# Patient Record
Sex: Male | Born: 1945 | Race: White | Hispanic: No | Marital: Married | Smoking: Former smoker
Health system: Southern US, Community
[De-identification: ages and names within clinical notes are randomized; demographics above are authoritative.]

## PROBLEM LIST (undated history)

## (undated) DIAGNOSIS — G4730 Sleep apnea, unspecified: Secondary | ICD-10-CM

## (undated) DIAGNOSIS — M1990 Unspecified osteoarthritis, unspecified site: Secondary | ICD-10-CM

## (undated) DIAGNOSIS — K219 Gastro-esophageal reflux disease without esophagitis: Secondary | ICD-10-CM

## (undated) DIAGNOSIS — E7800 Pure hypercholesterolemia, unspecified: Secondary | ICD-10-CM

## (undated) DIAGNOSIS — I6521 Occlusion and stenosis of right carotid artery: Secondary | ICD-10-CM

## (undated) DIAGNOSIS — I509 Heart failure, unspecified: Secondary | ICD-10-CM

## (undated) DIAGNOSIS — K7469 Other cirrhosis of liver: Secondary | ICD-10-CM

## (undated) DIAGNOSIS — R011 Cardiac murmur, unspecified: Secondary | ICD-10-CM

## (undated) DIAGNOSIS — D649 Anemia, unspecified: Secondary | ICD-10-CM

## (undated) DIAGNOSIS — I10 Essential (primary) hypertension: Secondary | ICD-10-CM

## (undated) DIAGNOSIS — N183 Chronic kidney disease, stage 3 unspecified: Secondary | ICD-10-CM

## (undated) DIAGNOSIS — E119 Type 2 diabetes mellitus without complications: Secondary | ICD-10-CM

## (undated) DIAGNOSIS — D61818 Other pancytopenia: Principal | ICD-10-CM

## (undated) HISTORY — PX: NASAL SINUS SURGERY: SHX719

## (undated) HISTORY — PX: CATARACT EXTRACTION W/ INTRAOCULAR LENS  IMPLANT, BILATERAL: SHX1307

## (undated) HISTORY — DX: Other cirrhosis of liver: K74.69

## (undated) HISTORY — DX: Other pancytopenia: D61.818

## (undated) HISTORY — PX: TONSILLECTOMY: SUR1361

## (undated) HISTORY — PX: WISDOM TOOTH EXTRACTION: SHX21

---

## 1998-05-27 ENCOUNTER — Other Ambulatory Visit: Admission: RE | Admit: 1998-05-27 | Discharge: 1998-05-27 | Payer: Self-pay | Admitting: *Deleted

## 1999-11-10 ENCOUNTER — Encounter: Admission: RE | Admit: 1999-11-10 | Discharge: 2000-02-08 | Payer: Self-pay | Admitting: Internal Medicine

## 2001-08-30 ENCOUNTER — Inpatient Hospital Stay (HOSPITAL_COMMUNITY): Admission: EM | Admit: 2001-08-30 | Discharge: 2001-09-02 | Payer: Self-pay

## 2001-08-30 ENCOUNTER — Encounter: Payer: Self-pay | Admitting: Emergency Medicine

## 2001-08-30 ENCOUNTER — Encounter: Payer: Self-pay | Admitting: General Surgery

## 2003-01-31 ENCOUNTER — Encounter: Payer: Self-pay | Admitting: Internal Medicine

## 2003-01-31 ENCOUNTER — Encounter: Admission: RE | Admit: 2003-01-31 | Discharge: 2003-01-31 | Payer: Self-pay | Admitting: Internal Medicine

## 2004-01-02 ENCOUNTER — Ambulatory Visit (HOSPITAL_COMMUNITY): Admission: RE | Admit: 2004-01-02 | Discharge: 2004-01-02 | Payer: Self-pay | Admitting: Gastroenterology

## 2004-07-21 ENCOUNTER — Encounter: Admission: RE | Admit: 2004-07-21 | Discharge: 2004-07-21 | Payer: Self-pay | Admitting: Internal Medicine

## 2004-10-14 ENCOUNTER — Emergency Department (HOSPITAL_COMMUNITY): Admission: EM | Admit: 2004-10-14 | Discharge: 2004-10-14 | Payer: Self-pay | Admitting: *Deleted

## 2007-10-03 ENCOUNTER — Encounter: Admission: RE | Admit: 2007-10-03 | Discharge: 2007-10-03 | Payer: Self-pay | Admitting: Internal Medicine

## 2008-05-14 ENCOUNTER — Encounter: Admission: RE | Admit: 2008-05-14 | Discharge: 2008-05-14 | Payer: Self-pay | Admitting: Internal Medicine

## 2010-12-07 ENCOUNTER — Emergency Department (HOSPITAL_COMMUNITY): Payer: No Typology Code available for payment source

## 2010-12-07 ENCOUNTER — Emergency Department (HOSPITAL_COMMUNITY)
Admission: EM | Admit: 2010-12-07 | Discharge: 2010-12-07 | Disposition: A | Discharge status: Home / Self Care | Payer: No Typology Code available for payment source | Attending: Surgery | Admitting: Surgery

## 2010-12-07 DIAGNOSIS — R51 Headache: Secondary | ICD-10-CM | POA: Insufficient documentation

## 2010-12-07 DIAGNOSIS — Y929 Unspecified place or not applicable: Secondary | ICD-10-CM | POA: Insufficient documentation

## 2010-12-07 DIAGNOSIS — I10 Essential (primary) hypertension: Secondary | ICD-10-CM | POA: Insufficient documentation

## 2010-12-07 DIAGNOSIS — E119 Type 2 diabetes mellitus without complications: Secondary | ICD-10-CM | POA: Insufficient documentation

## 2010-12-07 DIAGNOSIS — M545 Low back pain, unspecified: Secondary | ICD-10-CM | POA: Insufficient documentation

## 2010-12-07 DIAGNOSIS — S335XXA Sprain of ligaments of lumbar spine, initial encounter: Secondary | ICD-10-CM | POA: Insufficient documentation

## 2010-12-07 DIAGNOSIS — M542 Cervicalgia: Secondary | ICD-10-CM | POA: Insufficient documentation

## 2011-01-06 ENCOUNTER — Inpatient Hospital Stay (HOSPITAL_COMMUNITY): Payer: No Typology Code available for payment source

## 2011-01-06 ENCOUNTER — Inpatient Hospital Stay (HOSPITAL_COMMUNITY)
Admission: AD | Admit: 2011-01-06 | Discharge: 2011-01-10 | DRG: 390 | Disposition: A | Discharge status: Home / Self Care | Payer: No Typology Code available for payment source | Source: Ambulatory Visit | Attending: Internal Medicine | Admitting: Internal Medicine

## 2011-01-06 DIAGNOSIS — F3289 Other specified depressive episodes: Secondary | ICD-10-CM | POA: Diagnosis present

## 2011-01-06 DIAGNOSIS — Z794 Long term (current) use of insulin: Secondary | ICD-10-CM

## 2011-01-06 DIAGNOSIS — E669 Obesity, unspecified: Secondary | ICD-10-CM | POA: Diagnosis present

## 2011-01-06 DIAGNOSIS — Z882 Allergy status to sulfonamides status: Secondary | ICD-10-CM

## 2011-01-06 DIAGNOSIS — Z87891 Personal history of nicotine dependence: Secondary | ICD-10-CM

## 2011-01-06 DIAGNOSIS — F329 Major depressive disorder, single episode, unspecified: Secondary | ICD-10-CM | POA: Diagnosis present

## 2011-01-06 DIAGNOSIS — Z6831 Body mass index (BMI) 31.0-31.9, adult: Secondary | ICD-10-CM

## 2011-01-06 DIAGNOSIS — Z888 Allergy status to other drugs, medicaments and biological substances status: Secondary | ICD-10-CM

## 2011-01-06 DIAGNOSIS — E785 Hyperlipidemia, unspecified: Secondary | ICD-10-CM | POA: Diagnosis present

## 2011-01-06 DIAGNOSIS — K3184 Gastroparesis: Secondary | ICD-10-CM | POA: Diagnosis present

## 2011-01-06 DIAGNOSIS — E559 Vitamin D deficiency, unspecified: Secondary | ICD-10-CM | POA: Diagnosis present

## 2011-01-06 DIAGNOSIS — G4700 Insomnia, unspecified: Secondary | ICD-10-CM | POA: Diagnosis present

## 2011-01-06 DIAGNOSIS — E039 Hypothyroidism, unspecified: Secondary | ICD-10-CM | POA: Diagnosis present

## 2011-01-06 DIAGNOSIS — I10 Essential (primary) hypertension: Secondary | ICD-10-CM | POA: Diagnosis present

## 2011-01-06 DIAGNOSIS — Z79899 Other long term (current) drug therapy: Secondary | ICD-10-CM

## 2011-01-06 DIAGNOSIS — E291 Testicular hypofunction: Secondary | ICD-10-CM | POA: Diagnosis present

## 2011-01-06 DIAGNOSIS — E7800 Pure hypercholesterolemia, unspecified: Secondary | ICD-10-CM | POA: Diagnosis present

## 2011-01-06 DIAGNOSIS — N529 Male erectile dysfunction, unspecified: Secondary | ICD-10-CM | POA: Diagnosis present

## 2011-01-06 DIAGNOSIS — E1149 Type 2 diabetes mellitus with other diabetic neurological complication: Secondary | ICD-10-CM | POA: Diagnosis present

## 2011-01-06 DIAGNOSIS — Z7982 Long term (current) use of aspirin: Secondary | ICD-10-CM

## 2011-01-06 DIAGNOSIS — K56609 Unspecified intestinal obstruction, unspecified as to partial versus complete obstruction: Principal | ICD-10-CM | POA: Diagnosis present

## 2011-01-06 DIAGNOSIS — Z881 Allergy status to other antibiotic agents status: Secondary | ICD-10-CM

## 2011-01-06 LAB — GLUCOSE, CAPILLARY: Glucose-Capillary: 266 mg/dL — ABNORMAL HIGH (ref 70–99)

## 2011-01-07 ENCOUNTER — Inpatient Hospital Stay (HOSPITAL_COMMUNITY): Payer: No Typology Code available for payment source

## 2011-01-07 ENCOUNTER — Encounter (HOSPITAL_COMMUNITY): Payer: Self-pay | Admitting: Radiology

## 2011-01-07 LAB — BASIC METABOLIC PANEL
Calcium: 9.6 mg/dL (ref 8.4–10.5)
GFR calc Af Amer: 45 mL/min — ABNORMAL LOW (ref >60)
GFR calc non Af Amer: 37 mL/min — ABNORMAL LOW (ref >60)
Glucose, Bld: 207 mg/dL — ABNORMAL HIGH (ref 70–99)
Potassium: 4.4 mEq/L (ref 3.5–5.1)
Sodium: 135 mEq/L (ref 135–145)

## 2011-01-07 LAB — GLUCOSE, CAPILLARY
Glucose-Capillary: 170 mg/dL — ABNORMAL HIGH (ref 70–99)
Glucose-Capillary: 190 mg/dL — ABNORMAL HIGH (ref 70–99)
Glucose-Capillary: 191 mg/dL — ABNORMAL HIGH (ref 70–99)
Glucose-Capillary: 199 mg/dL — ABNORMAL HIGH (ref 70–99)
Glucose-Capillary: 252 mg/dL — ABNORMAL HIGH (ref 70–99)
Glucose-Capillary: 338 mg/dL — ABNORMAL HIGH (ref 70–99)

## 2011-01-07 LAB — CBC
MCHC: 32.8 g/dL (ref 30.0–36.0)
MCV: 92.4 fL (ref 78.0–100.0)
Platelets: 148 10*3/uL — ABNORMAL LOW (ref 150–400)
RDW: 14.5 % (ref 11.5–15.5)
WBC: 6 10*3/uL (ref 4.0–10.5)

## 2011-01-07 LAB — HEPATIC FUNCTION PANEL
AST: 23 U/L (ref 0–37)
Bilirubin, Direct: 0.3 mg/dL (ref 0.0–0.3)
Indirect Bilirubin: 0.5 mg/dL (ref 0.3–0.9)

## 2011-01-07 LAB — LIPASE, BLOOD: Lipase: 44 U/L (ref 11–59)

## 2011-01-07 MED ORDER — IOHEXOL 300 MG/ML  SOLN
80.0000 mL | Freq: Once | INTRAMUSCULAR | Status: AC | PRN
  Administered 2011-01-07: 80 mL via INTRAVENOUS

## 2011-01-08 ENCOUNTER — Inpatient Hospital Stay (HOSPITAL_COMMUNITY): Payer: No Typology Code available for payment source

## 2011-01-08 LAB — CBC
HCT: 42.9 % (ref 39.0–52.0)
Hemoglobin: 13.9 g/dL (ref 13.0–17.0)
MCV: 93.7 fL (ref 78.0–100.0)
RBC: 4.58 MIL/uL (ref 4.22–5.81)
WBC: 5 10*3/uL (ref 4.0–10.5)

## 2011-01-08 LAB — COMPREHENSIVE METABOLIC PANEL
ALT: 44 U/L (ref 0–53)
Alkaline Phosphatase: 65 U/L (ref 39–117)
BUN: 41 mg/dL — ABNORMAL HIGH (ref 6–23)
Chloride: 98 mEq/L (ref 96–112)
Glucose, Bld: 158 mg/dL — ABNORMAL HIGH (ref 70–99)
Potassium: 4 mEq/L (ref 3.5–5.1)
Sodium: 137 mEq/L (ref 135–145)
Total Bilirubin: 0.7 mg/dL (ref 0.3–1.2)
Total Protein: 6.6 g/dL (ref 6.0–8.3)

## 2011-01-08 LAB — GLUCOSE, CAPILLARY
Glucose-Capillary: 166 mg/dL — ABNORMAL HIGH (ref 70–99)
Glucose-Capillary: 235 mg/dL — ABNORMAL HIGH (ref 70–99)
Glucose-Capillary: 192 mg/dL — ABNORMAL HIGH (ref 70–99)

## 2011-01-09 ENCOUNTER — Inpatient Hospital Stay (HOSPITAL_COMMUNITY): Payer: No Typology Code available for payment source

## 2011-01-09 LAB — BASIC METABOLIC PANEL
CO2: 28 mEq/L (ref 19–32)
Calcium: 9.4 mg/dL (ref 8.4–10.5)
Creatinine, Ser: 1.25 mg/dL (ref 0.4–1.5)
Glucose, Bld: 163 mg/dL — ABNORMAL HIGH (ref 70–99)

## 2011-01-09 LAB — GLUCOSE, CAPILLARY
Glucose-Capillary: 181 mg/dL — ABNORMAL HIGH (ref 70–99)
Glucose-Capillary: 180 mg/dL — ABNORMAL HIGH (ref 70–99)

## 2011-01-10 LAB — DIFFERENTIAL
Eosinophils Absolute: 0.2 10*3/uL (ref 0.0–0.7)
Eosinophils Relative: 5 % (ref 0–5)
Lymphs Abs: 1.3 10*3/uL (ref 0.7–4.0)
Monocytes Absolute: 0.4 10*3/uL (ref 0.1–1.0)

## 2011-01-10 LAB — CBC
MCHC: 33 g/dL (ref 30.0–36.0)
MCV: 90.2 fL (ref 78.0–100.0)
Platelets: 126 10*3/uL — ABNORMAL LOW (ref 150–400)
RDW: 13.8 % (ref 11.5–15.5)
WBC: 5.1 10*3/uL (ref 4.0–10.5)

## 2011-01-10 LAB — COMPREHENSIVE METABOLIC PANEL
Albumin: 3.4 g/dL — ABNORMAL LOW (ref 3.5–5.2)
Alkaline Phosphatase: 64 U/L (ref 39–117)
BUN: 19 mg/dL (ref 6–23)
Calcium: 9.4 mg/dL (ref 8.4–10.5)
Creatinine, Ser: 1.14 mg/dL (ref 0.4–1.5)
Potassium: 3.8 mEq/L (ref 3.5–5.1)
Total Protein: 6.6 g/dL (ref 6.0–8.3)

## 2011-01-10 LAB — GLUCOSE, CAPILLARY
Glucose-Capillary: 148 mg/dL — ABNORMAL HIGH (ref 70–99)
Glucose-Capillary: 176 mg/dL — ABNORMAL HIGH (ref 70–99)
Glucose-Capillary: 226 mg/dL — ABNORMAL HIGH (ref 70–99)
Glucose-Capillary: 230 mg/dL — ABNORMAL HIGH (ref 70–99)

## 2011-01-11 NOTE — Consult Note (Signed)
NAMESALATHIEL, Potter               ACCOUNT NO.:  [id number]  MEDICAL RECORD NO.:  [id number]           PATIENT TYPE:  I  LOCATION:  [id number]                         FACILITY:  [facility]  PHYSICIAN:  Cyncere Sontag A. Rozlynn Lippold, M.D.DATE OF BIRTH:  1945-12-24  DATE OF CONSULTATION:  01/06/2011 DATE OF DISCHARGE:                                CONSULTATION   REQUESTING PHYSICIAN:  Henrine Screws, MD  REASON FOR CONSULTATION:  Abdominal pain, possible bowel obstruction.  HISTORY OF PRESENT ILLNESS:  Javier Potter is a pleasant 65 year old man who was in his usual state of health up until about 2 days ago.  He states he started having significant abdominal fullness and discomfort about an hour to 2 hours after "over" eating dinner.  He states he ate some fish with some side dishes.  The pain has really not relented.  Since that point, he had been very nauseated but has not vomited.  He has had 2 small bowel movements yesterday that were normal but he has not been able to pass any flatus.  He denies any fever, chills, chest pain, shortness of breath.  He denies any dysuria, hematuria.  He reports the pain is generalized, diffusely throughout his abdomen without any specific area of pain.  His bowel movements were normal leading up to this and he takes Metamucil on a daily basis that keeps him quite regular.  Interestingly, he had a similar episode like this in 2003, was admitted, had a bowel obstruction but his workup was essentially negative and his symptoms resolved.  Again, in 2009, he an episode of abdominal pain, was found to have gallstones but had a normal HIDA scan although his ejection fraction was slightly diminished at 38%.  Again, his symptoms resolved and he was discharged without any surgical intervention.  He was seen in the office today by his primary care provider and found to have evidence of dilated loops of bowel on a plain film with some abnormal labs including elevated liver  enzymes. Therefore, the patient was admitted for inpatient management and we have been asked to consult on the patient.  CT scan has been ordered but not yet performed.  PAST MEDICAL HISTORY:  Significant for: 1. Hypertension. 2. Type 2 diabetes. 3. Insomnia. 4. Obesity. 5. Hyperlipidemia. 6. History of gallstones. 7. History of hemorrhoids.  SURGICAL HISTORY:  The patient has had sinus surgery and septoplasty as well as a tonsillectomy but no prior abdominal surgeries.  FAMILY HISTORY:  Noncontributory to the present case.  SOCIAL HISTORY:  The patient is a former smoker, quit approximately 15 years ago.  Denies any alcohol or illicit drug use.  He is married and works in Herbalist.  MEDICATIONS:  As listed.  ALLERGIES:  AMBIEN, AUGMENTIN and SULFA drugs.  REVIEW OF SYSTEMS:  See in history of present illness for pertinent findings, otherwise, complete 10 system review found negative.  PHYSICAL EXAMINATION:  VITAL SIGNS:  Temperature of 97.9, heart rate of 84, respiratory rate of 22, blood pressure 121/75, oxygen saturation 95% on room air. GENERAL APPEARANCE:  A 65 year old gentleman who looks in some mild distress though  he is drinking the contrast for his scheduled CT.  He does not appear toxic appearing. ENT:  Otherwise, unremarkable. NECK:  Supple without lymphadenopathy.  Trachea is midline.  No thyromegaly or masses. LUNGS:  Clear to auscultation.  No wheezes, rhonchi or rales. HEART:  Regular rate and rhythm.  No murmurs, gallops or rubs.  Carotids 2+ and brisk without bruits.  Peripheral pulses intact and symmetrical. ABDOMEN:  Grossly distended with palpable loops of distended bowel.  No hernias are appreciated.  No mass effect is appreciated. RECTAL:  Deferred. GENITOURINARY:  Within normal limits.  No inguinal hernias or defects are appreciated. EXTREMITIES:  Good active range of motion in all extremities without crepitus or pain.  Normal muscle  strength and tone without atrophy. SKIN:  Otherwise, warm and dry with good turgor.  No rashes, lesions, nodules. NEUROLOGIC:  The patient alert and oriented x3.  DIAGNOSTICS:  Labs performed at the outpatient office showed a white blood cell count of 7.8, hemoglobin of 16.3, hematocrit of 46.1, platelet count of 200.  Metabolic panel shows a sodium of 131, potassium of 4.7, chloride of 94, CO2 of 23, BUN of 32, creatinine of 1.7, glucose of 347.  Liver enzymes slightly elevated with a bilirubin of 1.7, alkaline phosphatase of 69, AST of 42, ALT of 62, lipase of 95.  IMAGING:  Plain films show evidence of dilated loops of bowel.  IMPRESSION: 1. Abdominal pain and distention. 2. Elevated liver enzymes with a history of gallstones. 3. Possible partial small-bowel obstruction/ileus, a remote     possibility of gallstone ileus.  RECOMMENDATIONS:  Agree with n.p.o. status and antibiotics as ordered. We will await the CT scan to further identify possible acute process in the patient's abdomen.  If the patient's distention becomes quite uncomfortable or if he begins vomiting would recommend NG tube to low intermittent wall suction.  We will be having to continue consulting the patient and manage appropriately.     Javier Dike, PA-C   ______________________________ Javier Potter, M.D.    KB/MEDQ  D:  01/06/2011  T:  01/07/2011  Job:  [id number]  cc:   Henrine Screws, M.D.  Electronically Signed by Javier Potter  on 01/11/2011 01:25:32 PM Electronically Signed by Erroll Luna M.D. on 01/11/2011 01:47:48 PM

## 2011-01-14 NOTE — H&P (Signed)
[place]  Patient:    Javier Potter, Javier Potter Visit Number: [id number] MRN: [id number]          Service Type: MED Location: [phone] Attending Physician:  Gwenyth Ober Dictated by:   Judeth Horn, M.D. Admit Date:  08/30/2001   CC:         Henrine Screws, M.D.   History and Physical  IDENTIFICATION AND CHIEF COMPLAINT:  The patient is a 65 year old gentleman with abdominal pain localized to the right lower quadrant.  HISTORY OF PRESENT ILLNESS:  The patient has been ill for at least a couple of days.  Abdominal pain was starting on the right lower quadrant and is still in the right lower quadrant associated with nausea, vomiting in the emergency room only.  The patient did state that he had some chills and sweats at home just prior to coming in to the ED and, because of worsening pain, he came in for evaluation.  The patient had a non-contrast CT scan done in the emergency room which demonstrated no evidence of acute appendicitis but distended small-bowel loops.  No contrast was given because the patient was being worked up for a possible kidney stone.  However, the CT is being repeated with rectal contrast to rule out appendicitis.  He has a mildly elevated white cell count.  PAST MEDICAL HISTORY:  Significant for non-insulin-dependent diabetes, GERD, and he has had guaiac-positive stools in the past.  He has had a colonoscopy in November along with a flexible sigmoidoscopy, all of which were negative for tumor.  CURRENT MEDICATIONS:  Include Prevacid, glyburide, and Glucophage.  ALLERGIES:  SULFA.  PAST SURGICAL HISTORY:  He has had previous surgeries for sinuses and also for T&A.  REVIEW OF SYSTEMS:  His last bowel movement was over 24 hours ago.  He usually does go every day.  He has had no diarrhea, no definite fever, no history of hernias.  PHYSICAL EXAMINATION:  VITAL SIGNS:  His pulse was 114 on admission and went down  to 96 with hydration.  Blood pressure 92/53 initially, went up to 120/70 with hydration. Respirations 16, temperature 96.6 and 97.1.  He has not spiked.  HEENT:  He is normocephalic, atraumatic, and anicteric.  Pupils are equal, round and reactive to light.  NECK:  Supple.  CHEST:  Clear.  CARDIAC:  Regular rhythm and rate with no murmurs.  ABDOMEN:  Distended.  He has hypoactive bowel sounds.  No rebound with non-voluntary guarding in the right lower quadrant where he had some mild focal peritonitis.  He does not have any cough, tap, tenderness, or Rovsing sign.  RECTAL:  No focal masses.  Guaiac negative.  EXTREMITIES:  Negative for deformities or swelling.  LABORATORY DATA:  He was dehydrated with a hemoglobin of 16.9, hematocrit 50.1, white cell count 11.7.  Platelets were normal.  BUN 10, creatinine 1.2. He has mildly elevated ALT and AST with normal total bilirubin and alkaline phosphatase, lipase, and amylase.  He does have gallstones on CT scan without evidence of acute cholecystitis.  IMPRESSION: 1. Abdominal pain, unknown etiology. 2. There is a possibility of a bowel obstruction which would have to be from    tumor or hernia, and I do not find any hernias on his exam.  The patient    has had no previous intra-abdominal procedures.  PLAN:  Because of the possibility that he has gastroenteritis versus appendicitis, a CT scan with rectal contrast is to be  done.  We plan on doing this as soon as possible.  I will also manage his hyperglycemia.  The gallstone appears to be asymptomatic; however, with the abnormal liver function tests, it may be possible that he just has an unusual presentation of cholecystitis.  Will get the CT scan and then possibly get a HIDA scan in the morning. Dictated by:   Judeth Horn, M.D. Attending Physician:  Gwenyth Ober DD:  08/30/01 TD:  08/30/01 Job: [id number]

## 2011-01-14 NOTE — Discharge Summary (Signed)
[place]  Patient:    Javier Potter, Javier Potter Visit Number: [id number] MRN: [id number]          Service Type: MED Location: [phone] Attending Physician:  Gwenyth Ober Dictated by:   Judeth Horn, M.D. Admit Date:  08/30/2001 Discharge Date: 09/02/2001                             Discharge Summary  DISCHARGE DIAGNOSES: 1. Acute abdominal pain, unknown clear etiology, possible acute    gastroenteritis. 2. Non-insulin-dependent diabetes mellitus.  DISCHARGE MEDICATIONS:  None.  FOLLOWUP: 1. Dr. Hulen Skains as needed. 2. Schedule an appointment to see Dr. Inda Merlin.  DIET:  Diabetic ADA diet.  HOSPITAL COURSE:  The patient was admitted with acute abdominal pain.  Dr. Inda Merlin sent him over, possible acute appendicitis or cholecystitis.  Although he had abnormal liver function tests, his pain was in the right lower quadrant.  A CT scan with contrast was done which showed no evidence of acute appendicitis, but he had distended and thickened small bowel loops consistent with a possible peritonitis.  He had previous sigmoidoscopy and colonoscopy.  The patient was admitted overnight, and by the following day after admission with hydration, he was 100% improved with no abdominal pain, good bowel sounds, and had some diarrhea.  He continued to improve over the next 48 hours, and was discharged to home on hospital day #3, eating well, having normal bowel movements, passing gas, with no abdominal pain.  His followup was to see Dr. Inda Merlin.  He was afebrile at the time of discharge.  Not placed on any antibiotics during this course. Dictated by:   Judeth Horn, M.D. Attending Physician:  Gwenyth Ober DD:  09/10/01 TD:  09/11/01 Job: [id number]

## 2011-01-20 NOTE — H&P (Signed)
NAMEJACORRI, CROMBIE               ACCOUNT NO.:  [id number]  MEDICAL RECORD NO.:  [id number]           PATIENT TYPE:  I  LOCATION:  [id number]                         FACILITY:  [facility]  PHYSICIAN:  Henrine Screws, M.D.DATE OF BIRTH:  [birth year]  DATE OF ADMISSION:  01/06/2011 DATE OF DISCHARGE:                             HISTORY & PHYSICAL   PROBLEM:  Generalized abdominal pain with history of bowel obstruction. The patient has a 2-day history of generalized abdominal pain that is burning in nature.  He has been nauseated without vomiting.  Only had 2 small bowel movements.  Cannot burp or pass gas.  Has a history of small bowel obstruction in the past.  No prior abdominal surgeries.  He has had shaking, chills, and sweats.  CURRENT MEDICATIONS: 1. Anusol-HC 25 mg 1 a day. 2. Trilipix 135 mg 1 a day. 3. Fish oil 1200 mg 4 capsules a day. 4. Testosterone 200 mg in 1 mL oil, 1 mL IM q.2 weeks. 5. Aspirin 81 mg 1 a day. 6. Vitamin B12 injection 1000 UG liquid monthly. 7. Zyrtec allergy 10 mg 1 a day. 8. Crestor 10 mg 1 a day. 9. Cialis 20 mg 1 tablet as needed. 10.Fluticasone 1 puff 2-3 times a week. 11.Glyburide 5 mg 1 b.i.d. 12.Niacin flush free 1 a day. 13.Chlorthalidone 25 mg 1 a day. 14.Toprol-XL 50 mg 1 a day. 15.Lantus 100 units/mL solution 90 units a.m. and p.m. 16.Zestril 20 mg 1 a day. 17.Klor-Con 20 mEq 1 a day. 18.Remeron 15 mg 1 at bedtime. 19.Prilosec 20 mg 1 b.i.d. 20.Metformin 1000 mg b.i.d. 21.Citalopram 40 mg 1 a day. 22.NovoLog pen 100 units/mL solution 36 units 3 times a day.  MEDICAL HISTORY: 1. Hypertension. 2. Type 2 diabetes mellitus. 3. Hypothyroidism. 4. Insomnia. 5. Obesity. 6. Folliculitis. 7. Hypercholesterolemia. 8. 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 9. Actinic keratosis and seborrheic keratosis. 10.Major depression in August 2010. 11.B12 deficiency. 12.Hemorrhoid/diminished anal tone, Dr. Armandina Gemma in November 2011.  DRUG  ALLERGIES AND INTOLERANCES: 1. AMBIEN CR, question of depression reaction. 2. AUGMENTIN, abdominal pain/diarrhea. 3. SULFA DRUG, blister on tongue.  SURGICAL HISTORY: 1. Extensive sinus surgery. 2. Tonsillectomy at age 41. 3. Septoplasty with bilateral endoscopic sinus surgery, Dr. Harle Battiest in     1999.  No abdominal surgeries  FAMILY HISTORY:  Father died of lung cancer and tongue cancer at the age of 61.  Mother died at age 40 of renal failure.  One sister with back pain.  Two children are alive and well.  SOCIAL HISTORY:  Former smoker, quit 15 years ago, 3-pack a day history. Alcohol rare.  No recreational drug.  His occupation is Press photographer, Insurance underwriter.  His marital status is married.  REVIEW OF SYSTEMS:  Complains of generalized abdominal pain with nausea. No vomiting.  Shaking, chills, and sweats.  Two small bowel movements. Not passing gas or burping.  History of small bowel obstruction.  PHYSICAL EXAMINATION:  VITAL SIGNS:  Weight 197 which is a 6-pound weight loss.  Height 66 inches.  BMI of 31.79.  Temperature 98.5.  Pulse 80.  BP 120/80. GENERAL APPEARANCE:  Alert and oriented.  SKIN:  Normal. HEENT:  Grossly normal.  Oral cavity clear. CHEST:  Clear to auscultation. HEART:  No S3, S4, murmurs, or rubs. ABDOMEN:  Generally tender throughout.  More specific pain in the right lower quadrant.  There are no masses or organomegaly.  No rebound or peritonitis and no bowel sounds auscultated.  ASSESSMENT:  Abdominal pain with history of bowel obstruction.  I did proceed with a flat and upright of the abdomen.  Normal to my eye.  Did proceed with CBC.  Neutrophil percent 84.1.  Lymphocyte percent 10.8. Monocyte percent 4.3.  Lymphocytic number 0.80.  Comprehensive metabolic:  Glucose of [id number].  BUN of 32 with creatinine of 1.72.  GFR of 40.23.  Sodium of 131.  Chloride of 94.  Calcium of 11.5.  Total bilirubin of 1.7.  AST 42, ALT 62, otherwise normal.  Lipase mildly elevated at  95. Urinalysis:  Trace of ketones, +1 bilirubin, otherwise normal.  The patient is admitted for further evaluation including CT of the abdomen and pelvis.  We will start Cipro and Flagyl IV to cover diverticulitis.     Marliss Coots, N.P.   ______________________________ Henrine Screws, M.D.    MAP/MEDQ  D:  01/06/2011  T:  01/07/2011  Job:  [id number]  Electronically Signed by Jack Quarto N.P. on 01/19/2011 10:13:23 AM Electronically Signed by Josetta Huddle M.D. on 01/20/2011 [id number] PM

## 2011-01-20 NOTE — Discharge Summary (Signed)
Javier Potter, Javier Potter               ACCOUNT NO.:  [id number]  MEDICAL RECORD NO.:  [id number]           PATIENT TYPE:  I  LOCATION:  [id number]                         FACILITY:  [facility]  PHYSICIAN:  Henrine Screws, M.D.DATE OF BIRTH:  May 12, 1946  DATE OF ADMISSION:  01/06/2011 DATE OF DISCHARGE:  01/10/2011                              DISCHARGE SUMMARY   DISCHARGE DIAGNOSES: 1. Small-bowel obstruction felt to be in the area of the distal small     bowel.  The patient had episode approximately 10 years ago of small-     bowel obstruction with no recurrences until this one. 2. 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  There was free fluid in the right lower     quadrant around the cecum and extending down to the pelvis     suggesting inflammatory process which was the likely cause of     obstruction.  There was mild thickening to the wall of the terminal     ileum.  There was also stenosis of the sigmoid colon without     inflammatory change. 3. Insulin-dependent diabetes mellitus. 4. History of hemorrhoids. 5. Hypogonadism with testosterone level decreased at 174 during this     hospitalization. 6. Allergies. 7. Hyperlipidemia. 8. Erectile dysfunction. 9. Hypertension. 10.Hypothyroidism. 11.Obesity. 12.Probable mild diabetic gastroparesis. 13.Major depression August 2010. 14.Vitamin B12 deficiency. 15.History of insomnia.  DRUG ALLERGIES AND INTOLERANCES:  AMBIEN CR has questionably caused depression.  AUGMENTIN has caused abdominal pain, diarrhea, and SULFA drugs have caused blisters on his tongue.  DISCHARGE MEDICATIONS: 1. Aspirin 325 mg daily. 2. Chlorthalidone 25 mg daily. 3. Clonazepam 1 mg daily. 4. Crestor 10 mg daily. 5. Fish oil 1 gram b.i.d. 6. Glyburide 5 mg b.i.d. 7. Iron tablet daily. 8. Lantus 9 units subcu b.i.d. 9. Lisinopril 10 mg daily. 10.Magnesium chloride over-the-counter 1 tablet daily as  per prior to     admission. 11.Metformin 1 gram daily. 12.Metoprolol XL succinate 50 mg daily. 13.Multivitamin daily. 14.NovoLog insulin 36 units subcu 3 times daily with meals. 15.Niacin flush free 1 tablet daily. 16.Potassium chloride 20 mEq daily. 17.Prilosec 20 mg b.i.d. 18.Zyrtec 10 mg daily.  CONSULTATIONS:  Jan 06, 2011 Dr. Erroll Luna, General Surgery.  The patient does also have a history of gallstones.  DISCHARGE LABORATORIES:  From Jan 10, 2011, white count 5100, hemoglobin 13.7, platelet count 126,000 with normal differential, 63% neutrophils, 25% lymphocytes, 7% monocytes, 5% eosinophils.  Comprehensive metabolic profile on Jan 10, 2011, sodium 137, potassium 3.8, chloride 101, bicarb 26, glucose 199, BUN 19, creatinine 1.14, total bili 0.6, alk phos 64, SGOT 47, SGPT 43, albumin 3.4, calcium 9.4.  Vitamin B12 was normal at 542 on Jan 08, 2011.  TSH was 3.78 and testosterone level was low at 175.  Normal levels of 241-827.  Lipase on Jan 08, 2011 was normal at 45 and on admission was 37, normal.  HOSPITAL COURSE:  Mr. Caryl Comes is very pleasant 65 year old male who had a small bowel obstruction about 10 years ago.  He started having significant  abdominal fullness and discomfort after eating dinner the night prior to admission.  He had had some fishes from side dishes. Pain persisted and he became very nauseous.  He had 2 small bowel movements on the day prior to admission.  He presented to [facility] with generalized diffuse abdominal pain, right lower quadrant more tender than left.  He was quite distended.  He also was having some chills, rigors, and sweats.  He was admitted and abdominal x-ray and CT scan revealed small bowel obstruction primarily related to the right lower quadrant.  This seemed to be some terminal ileum thickening and free fluid but there was also some sigmoid narrowing as well.  The patient was put on IV fluids and IV Cipro  and Flagyl and ultimately needed an NG tube to help decompress the small bowel.  This was removed the day prior to discharge and he tolerated this well as well as meals. He will be discharged home if he tolerates breakfast and lunch today. He will be fed a soft diet.  No concentrated sweets.  While admitted, his diabetes was controlled with sliding scale NovoLog and Lantus.  Other oral medications have been resumed.  The patient will be discharged home in improved condition this afternoon if he tolerates breakfast and lunch and we will plan for GI consultation as an outpatient for probable colonoscopy.  Cause for small bowel obstruction is unknown at this time.  Time spent reviewing chart, discussing clinical status with the patient and performing discharge summary was 35 minutes.     Henrine Screws, M.D.     RNG/MEDQ  D:  01/10/2011  T:  01/10/2011  Job:  [id number]  Electronically Signed by Josetta Huddle M.D. on 01/20/2011 06:08:57 PM

## 2013-12-04 ENCOUNTER — Encounter (HOSPITAL_COMMUNITY): Payer: Self-pay | Admitting: Internal Medicine

## 2013-12-05 ENCOUNTER — Encounter: Payer: Self-pay | Admitting: Cardiology

## 2013-12-05 ENCOUNTER — Ambulatory Visit (HOSPITAL_COMMUNITY): Payer: No Typology Code available for payment source | Attending: Cardiology | Admitting: Radiology

## 2013-12-05 VITALS — BP 144/79 | Ht 66.0 in | Wt 191.0 lb

## 2013-12-05 DIAGNOSIS — I10 Essential (primary) hypertension: Secondary | ICD-10-CM | POA: Insufficient documentation

## 2013-12-05 DIAGNOSIS — Z794 Long term (current) use of insulin: Secondary | ICD-10-CM | POA: Insufficient documentation

## 2013-12-05 DIAGNOSIS — I517 Cardiomegaly: Secondary | ICD-10-CM | POA: Insufficient documentation

## 2013-12-05 DIAGNOSIS — Z87891 Personal history of nicotine dependence: Secondary | ICD-10-CM | POA: Insufficient documentation

## 2013-12-05 DIAGNOSIS — M25569 Pain in unspecified knee: Secondary | ICD-10-CM | POA: Insufficient documentation

## 2013-12-05 DIAGNOSIS — R9431 Abnormal electrocardiogram [ECG] [EKG]: Secondary | ICD-10-CM | POA: Insufficient documentation

## 2013-12-05 DIAGNOSIS — Z8249 Family history of ischemic heart disease and other diseases of the circulatory system: Secondary | ICD-10-CM | POA: Insufficient documentation

## 2013-12-05 DIAGNOSIS — E119 Type 2 diabetes mellitus without complications: Secondary | ICD-10-CM | POA: Insufficient documentation

## 2013-12-05 MED ORDER — TECHNETIUM TC 99M SESTAMIBI GENERIC - CARDIOLITE
33.0000 | Freq: Once | INTRAVENOUS | Status: AC | PRN
  Administered 2013-12-05: 33 via INTRAVENOUS

## 2013-12-05 MED ORDER — REGADENOSON 0.4 MG/5ML IV SOLN
0.4000 mg | Freq: Once | INTRAVENOUS | Status: AC
  Administered 2013-12-05: 0.4 mg via INTRAVENOUS

## 2013-12-05 MED ORDER — TECHNETIUM TC 99M SESTAMIBI GENERIC - CARDIOLITE
11.0000 | Freq: Once | INTRAVENOUS | Status: AC | PRN
Start: 2013-12-05 — End: 2013-12-05
  Administered 2013-12-05: 11 via INTRAVENOUS

## 2013-12-05 NOTE — Progress Notes (Signed)
[facility] [address] [place], [place] [address] [phone]    Cardiology Nuclear Med Study  Javier Potter is a 68 y.o. male     MRN : [id number]     DOB: April 01, 1946  Procedure Date: 12/05/2013  Nuclear Med Background Indication for Stress Test:  Evaluation for Ischemia and Abnormal EKG History:  No cardiac history Cardiac Risk Factors: Family History - CAD, History of Smoking, Hypertension, IDDM, and Lipids  Symptoms:  No cardiac symptoms   Nuclear Pre-Procedure Caffeine/Decaff Intake:  None > 12 hrs NPO After: 9:30pm   Lungs:  clear O2 Sat: 98% on room air. IV 0.9% NS with Angio Cath:  22g  IV Site: R Antecubital x 1, tolerated well IV Started by:  Javier Baltimore, RN  Chest Size (in):  44 Cup Size: n/a  Height: 5\' 6"  (1.676 m)  Weight:  191 lb (86.637 kg)  BMI:  Body mass index is 30.84 kg/(m^2). Tech Comments:  Held metoprolol x 24 hrs. Fasting CBG was 267 at 0930 with 1/2 dose Novolog and Lantus insulin taken with breakfast. Javier Baltimore, RN    Nuclear Med Study 1 or 2 day study: 1 day  Stress Test Type:  Stress  Reading MD: N/A  Order Authorizing Provider:  Josetta Huddle, MD  Resting Radionuclide: Technetium 18m Sestamibi  Resting Radionuclide Dose: 11.0 mCi   Stress Radionuclide:  Technetium 44m Sestamibi  Stress Radionuclide Dose: 33.0 mCi           Stress Protocol Rest HR: 69 Stress HR: 104  Rest BP: 144/79 Stress BP: 183/56  Exercise Time (min): 5:36 METS: 5.4   Predicted Max HR: 153 bpm % Max HR: 67.97 bpm Rate Pressure Product: 19032   Dose of Adenosine (mg):  n/a Dose of Lexiscan: 0.4 mg  Dose of Atropine (mg): n/a Dose of Dobutamine: n/a mcg/kg/min (at max HR)  Stress Test Technologist: Crissie Figures, RN  Nuclear Technologist:  Charlton Amor, CNMT     Rest Procedure:  Myocardial perfusion imaging was performed at rest 45 minutes following the intravenous administration of Technetium 30m Sestamibi. Rest ECG: NSR  inferior ST segment changes  Stress Procedure:  The patient attempted to walk the treadmill utilizing the Bruce protocol. He was unable to achieve target heart rate due to knee pain and was changed to a walking Lexiscan. The patient received IV Lexiscan 0.4 mg over 15-seconds with concurrent low level exercise and then Technetium 21m Sestamibi was injected at 30-seconds while the patient continued walking one more minute.  Quantitative spect images were obtained after a 45-minute delay. Stress ECG: No significant change from baseline ECG  QPS Raw Data Images:  Normal; no motion artifact; normal heart/lung ratio. Stress Images:  Normal homogeneous uptake in all areas of the myocardium. Rest Images:  Normal homogeneous uptake in all areas of the myocardium. Subtraction (SDS):  Normal Transient Ischemic Dilatation (Normal <1.22):  0.95 Lung/Heart Ratio (Normal <0.45):  0.38  Quantitative Gated Spect Images QGS EDV:  126 ml QGS ESV:  68 ml  Impression Exercise Capacity:  Poor exercise capacity. BP Response:  Normal blood pressure response. Clinical Symptoms:  Knee Pain ECG Impression:  No significant ST segment change suggestive of ischemia. Comparison with Prior Nuclear Study: No images to compare  Overall Impression:  Low risk stress nuclear study Evidence for LVH and mild diffuse hypokinesis EF 46%  No perfusion defects.  LV Ejection Fraction: 46%.  LV Wall Motion:  Diffuse hypokinesis  mildly decreased EF     Josue Hector

## 2014-02-12 DIAGNOSIS — Z23 Encounter for immunization: Secondary | ICD-10-CM | POA: Diagnosis not present

## 2014-02-12 DIAGNOSIS — E669 Obesity, unspecified: Secondary | ICD-10-CM | POA: Diagnosis not present

## 2014-02-12 DIAGNOSIS — N183 Chronic kidney disease, stage 3 unspecified: Secondary | ICD-10-CM | POA: Diagnosis not present

## 2014-02-12 DIAGNOSIS — E1165 Type 2 diabetes mellitus with hyperglycemia: Secondary | ICD-10-CM | POA: Diagnosis not present

## 2014-02-12 DIAGNOSIS — E538 Deficiency of other specified B group vitamins: Secondary | ICD-10-CM | POA: Diagnosis not present

## 2014-02-12 DIAGNOSIS — E1129 Type 2 diabetes mellitus with other diabetic kidney complication: Secondary | ICD-10-CM | POA: Diagnosis not present

## 2014-02-12 DIAGNOSIS — E291 Testicular hypofunction: Secondary | ICD-10-CM | POA: Diagnosis not present

## 2014-02-12 DIAGNOSIS — Z6831 Body mass index (BMI) 31.0-31.9, adult: Secondary | ICD-10-CM | POA: Diagnosis not present

## 2014-02-27 DIAGNOSIS — E291 Testicular hypofunction: Secondary | ICD-10-CM | POA: Diagnosis not present

## 2014-03-13 DIAGNOSIS — E291 Testicular hypofunction: Secondary | ICD-10-CM | POA: Diagnosis not present

## 2014-03-13 DIAGNOSIS — E538 Deficiency of other specified B group vitamins: Secondary | ICD-10-CM | POA: Diagnosis not present

## 2014-03-21 DIAGNOSIS — R252 Cramp and spasm: Secondary | ICD-10-CM | POA: Diagnosis not present

## 2014-03-21 DIAGNOSIS — N183 Chronic kidney disease, stage 3 unspecified: Secondary | ICD-10-CM | POA: Diagnosis not present

## 2014-03-21 DIAGNOSIS — R3911 Hesitancy of micturition: Secondary | ICD-10-CM | POA: Diagnosis not present

## 2014-03-28 DIAGNOSIS — E291 Testicular hypofunction: Secondary | ICD-10-CM | POA: Diagnosis not present

## 2014-04-11 DIAGNOSIS — E291 Testicular hypofunction: Secondary | ICD-10-CM | POA: Diagnosis not present

## 2014-04-25 DIAGNOSIS — E291 Testicular hypofunction: Secondary | ICD-10-CM | POA: Diagnosis not present

## 2014-04-25 DIAGNOSIS — E538 Deficiency of other specified B group vitamins: Secondary | ICD-10-CM | POA: Diagnosis not present

## 2014-04-30 DIAGNOSIS — R252 Cramp and spasm: Secondary | ICD-10-CM | POA: Diagnosis not present

## 2014-05-15 DIAGNOSIS — E291 Testicular hypofunction: Secondary | ICD-10-CM | POA: Diagnosis not present

## 2014-05-15 DIAGNOSIS — Z23 Encounter for immunization: Secondary | ICD-10-CM | POA: Diagnosis not present

## 2014-05-15 DIAGNOSIS — E669 Obesity, unspecified: Secondary | ICD-10-CM | POA: Diagnosis not present

## 2014-05-15 DIAGNOSIS — Z1211 Encounter for screening for malignant neoplasm of colon: Secondary | ICD-10-CM | POA: Diagnosis not present

## 2014-05-15 DIAGNOSIS — Z6831 Body mass index (BMI) 31.0-31.9, adult: Secondary | ICD-10-CM | POA: Diagnosis not present

## 2014-05-15 DIAGNOSIS — E1165 Type 2 diabetes mellitus with hyperglycemia: Secondary | ICD-10-CM | POA: Diagnosis not present

## 2014-05-15 DIAGNOSIS — Z79899 Other long term (current) drug therapy: Secondary | ICD-10-CM | POA: Diagnosis not present

## 2014-05-15 DIAGNOSIS — N183 Chronic kidney disease, stage 3 unspecified: Secondary | ICD-10-CM | POA: Diagnosis not present

## 2014-05-15 DIAGNOSIS — E1129 Type 2 diabetes mellitus with other diabetic kidney complication: Secondary | ICD-10-CM | POA: Diagnosis not present

## 2014-05-22 ENCOUNTER — Other Ambulatory Visit: Payer: Self-pay | Admitting: Internal Medicine

## 2014-05-22 DIAGNOSIS — R011 Cardiac murmur, unspecified: Secondary | ICD-10-CM | POA: Diagnosis not present

## 2014-05-22 DIAGNOSIS — R160 Hepatomegaly, not elsewhere classified: Secondary | ICD-10-CM | POA: Diagnosis not present

## 2014-05-22 DIAGNOSIS — Z0000 Encounter for general adult medical examination without abnormal findings: Secondary | ICD-10-CM | POA: Diagnosis not present

## 2014-05-22 DIAGNOSIS — E291 Testicular hypofunction: Secondary | ICD-10-CM | POA: Diagnosis not present

## 2014-05-22 DIAGNOSIS — E1129 Type 2 diabetes mellitus with other diabetic kidney complication: Secondary | ICD-10-CM | POA: Diagnosis not present

## 2014-05-22 DIAGNOSIS — E669 Obesity, unspecified: Secondary | ICD-10-CM | POA: Diagnosis not present

## 2014-05-22 DIAGNOSIS — Z23 Encounter for immunization: Secondary | ICD-10-CM | POA: Diagnosis not present

## 2014-05-22 DIAGNOSIS — N183 Chronic kidney disease, stage 3 unspecified: Secondary | ICD-10-CM | POA: Diagnosis not present

## 2014-05-30 DIAGNOSIS — E1121 Type 2 diabetes mellitus with diabetic nephropathy: Secondary | ICD-10-CM | POA: Diagnosis not present

## 2014-05-30 DIAGNOSIS — I10 Essential (primary) hypertension: Secondary | ICD-10-CM | POA: Diagnosis not present

## 2014-05-30 DIAGNOSIS — Z125 Encounter for screening for malignant neoplasm of prostate: Secondary | ICD-10-CM | POA: Diagnosis not present

## 2014-05-30 DIAGNOSIS — E1165 Type 2 diabetes mellitus with hyperglycemia: Secondary | ICD-10-CM | POA: Diagnosis not present

## 2014-05-30 DIAGNOSIS — Z1211 Encounter for screening for malignant neoplasm of colon: Secondary | ICD-10-CM | POA: Diagnosis not present

## 2014-05-30 DIAGNOSIS — N183 Chronic kidney disease, stage 3 (moderate): Secondary | ICD-10-CM | POA: Diagnosis not present

## 2014-05-30 DIAGNOSIS — E669 Obesity, unspecified: Secondary | ICD-10-CM | POA: Diagnosis not present

## 2014-05-30 DIAGNOSIS — E291 Testicular hypofunction: Secondary | ICD-10-CM | POA: Diagnosis not present

## 2014-06-02 ENCOUNTER — Ambulatory Visit
Admission: RE | Admit: 2014-06-02 | Discharge: 2014-06-02 | Disposition: A | Discharge status: Home / Self Care | Payer: Medicare Other | Source: Ambulatory Visit | Attending: Internal Medicine | Admitting: Internal Medicine

## 2014-06-02 DIAGNOSIS — K8020 Calculus of gallbladder without cholecystitis without obstruction: Secondary | ICD-10-CM | POA: Diagnosis not present

## 2014-06-02 DIAGNOSIS — K7689 Other specified diseases of liver: Secondary | ICD-10-CM | POA: Diagnosis not present

## 2014-06-02 DIAGNOSIS — R160 Hepatomegaly, not elsewhere classified: Secondary | ICD-10-CM

## 2014-06-06 ENCOUNTER — Other Ambulatory Visit (HOSPITAL_COMMUNITY): Payer: Self-pay | Admitting: Internal Medicine

## 2014-06-06 ENCOUNTER — Ambulatory Visit (HOSPITAL_COMMUNITY): Payer: Medicare Other | Attending: Internal Medicine | Admitting: Radiology

## 2014-06-06 DIAGNOSIS — R011 Cardiac murmur, unspecified: Secondary | ICD-10-CM

## 2014-06-06 NOTE — Progress Notes (Signed)
Echocardiogram performed.  

## 2014-06-10 ENCOUNTER — Other Ambulatory Visit (HOSPITAL_COMMUNITY): Payer: Medicare Other

## 2014-06-16 DIAGNOSIS — E291 Testicular hypofunction: Secondary | ICD-10-CM | POA: Diagnosis not present

## 2014-06-30 DIAGNOSIS — E291 Testicular hypofunction: Secondary | ICD-10-CM | POA: Diagnosis not present

## 2014-06-30 DIAGNOSIS — E538 Deficiency of other specified B group vitamins: Secondary | ICD-10-CM | POA: Diagnosis not present

## 2014-07-09 ENCOUNTER — Other Ambulatory Visit: Payer: Self-pay | Admitting: Gastroenterology

## 2014-07-09 DIAGNOSIS — K8020 Calculus of gallbladder without cholecystitis without obstruction: Secondary | ICD-10-CM | POA: Diagnosis not present

## 2014-07-09 DIAGNOSIS — K760 Fatty (change of) liver, not elsewhere classified: Secondary | ICD-10-CM | POA: Diagnosis not present

## 2014-07-09 DIAGNOSIS — R162 Hepatomegaly with splenomegaly, not elsewhere classified: Secondary | ICD-10-CM | POA: Diagnosis not present

## 2014-07-09 DIAGNOSIS — D649 Anemia, unspecified: Secondary | ICD-10-CM | POA: Diagnosis not present

## 2014-07-23 ENCOUNTER — Encounter (HOSPITAL_COMMUNITY): Payer: Self-pay | Admitting: *Deleted

## 2014-07-23 NOTE — Progress Notes (Signed)
07/09/2014-last office note with Dr. Earle Gell on chart

## 2014-07-27 NOTE — Anesthesia Preprocedure Evaluation (Signed)
Anesthesia Evaluation  Patient identified by MRN, date of birth, ID band Patient awake    Reviewed: Allergy & Precautions, H&P , NPO status , Patient's Chart, lab work & pertinent test results  History of Anesthesia Complications Negative for: history of anesthetic complications  Airway Mallampati: II  TM Distance: >3 FB Neck ROM: Full    Dental no notable dental hx. (+) Dental Advisory Given   Pulmonary sleep apnea and Continuous Positive Airway Pressure Ventilation , former smoker,  breath sounds clear to auscultation  Pulmonary exam normal       Cardiovascular hypertension, Pt. on medications and Pt. on home beta blockers + Valvular Problems/Murmurs Rhythm:Regular Rate:Normal  TTE 10/15: EF 65-70%, mild LVH, no valve issues Normal stress test 4/15   Neuro/Psych PSYCHIATRIC DISORDERS Anxiety Depression negative neurological ROS     GI/Hepatic negative GI ROS, Neg liver ROS,   Endo/Other  diabetes, Type 2, Insulin Dependent, Oral Hypoglycemic Agents  Renal/GU negative Renal ROS  negative genitourinary   Musculoskeletal negative musculoskeletal ROS (+)   Abdominal   Peds negative pediatric ROS (+)  Hematology negative hematology ROS (+)   Anesthesia Other Findings   Reproductive/Obstetrics negative OB ROS                             Anesthesia Physical Anesthesia Plan  ASA: III  Anesthesia Plan: MAC   Post-op Pain Management:    Induction: Intravenous  Airway Management Planned: Nasal Cannula  Additional Equipment:   Intra-op Plan:   Post-operative Plan: Extubation in OR  Informed Consent: I have reviewed the patients History and Physical, chart, labs and discussed the procedure including the risks, benefits and alternatives for the proposed anesthesia with the patient or authorized representative who has indicated his/her understanding and acceptance.   Dental advisory  given  Plan Discussed with: CRNA  Anesthesia Plan Comments:         Anesthesia Quick Evaluation

## 2014-07-28 ENCOUNTER — Encounter (HOSPITAL_COMMUNITY): Payer: Self-pay | Admitting: *Deleted

## 2014-07-28 ENCOUNTER — Ambulatory Visit (HOSPITAL_COMMUNITY): Payer: Medicare Other | Admitting: Anesthesiology

## 2014-07-28 ENCOUNTER — Encounter (HOSPITAL_COMMUNITY)
Admission: RE | Disposition: A | Discharge status: Home / Self Care | Payer: Self-pay | Source: Ambulatory Visit | Attending: Gastroenterology

## 2014-07-28 ENCOUNTER — Other Ambulatory Visit: Payer: Self-pay | Admitting: Gastroenterology

## 2014-07-28 ENCOUNTER — Ambulatory Visit (HOSPITAL_COMMUNITY)
Admission: RE | Admit: 2014-07-28 | Discharge: 2014-07-28 | Disposition: A | Discharge status: Home / Self Care | Payer: Medicare Other | Source: Ambulatory Visit | Attending: Gastroenterology | Admitting: Gastroenterology

## 2014-07-28 DIAGNOSIS — Z888 Allergy status to other drugs, medicaments and biological substances status: Secondary | ICD-10-CM | POA: Diagnosis not present

## 2014-07-28 DIAGNOSIS — E669 Obesity, unspecified: Secondary | ICD-10-CM | POA: Diagnosis not present

## 2014-07-28 DIAGNOSIS — F329 Major depressive disorder, single episode, unspecified: Secondary | ICD-10-CM | POA: Insufficient documentation

## 2014-07-28 DIAGNOSIS — G4733 Obstructive sleep apnea (adult) (pediatric): Secondary | ICD-10-CM | POA: Diagnosis not present

## 2014-07-28 DIAGNOSIS — I10 Essential (primary) hypertension: Secondary | ICD-10-CM | POA: Diagnosis not present

## 2014-07-28 DIAGNOSIS — D125 Benign neoplasm of sigmoid colon: Secondary | ICD-10-CM | POA: Diagnosis not present

## 2014-07-28 DIAGNOSIS — K317 Polyp of stomach and duodenum: Secondary | ICD-10-CM | POA: Diagnosis not present

## 2014-07-28 DIAGNOSIS — K8080 Other cholelithiasis without obstruction: Secondary | ICD-10-CM | POA: Insufficient documentation

## 2014-07-28 DIAGNOSIS — D649 Anemia, unspecified: Secondary | ICD-10-CM | POA: Diagnosis not present

## 2014-07-28 DIAGNOSIS — G4700 Insomnia, unspecified: Secondary | ICD-10-CM | POA: Insufficient documentation

## 2014-07-28 DIAGNOSIS — Z794 Long term (current) use of insulin: Secondary | ICD-10-CM | POA: Insufficient documentation

## 2014-07-28 DIAGNOSIS — K635 Polyp of colon: Secondary | ICD-10-CM | POA: Diagnosis not present

## 2014-07-28 DIAGNOSIS — K2970 Gastritis, unspecified, without bleeding: Secondary | ICD-10-CM | POA: Diagnosis not present

## 2014-07-28 DIAGNOSIS — E538 Deficiency of other specified B group vitamins: Secondary | ICD-10-CM | POA: Diagnosis not present

## 2014-07-28 DIAGNOSIS — E780 Pure hypercholesterolemia: Secondary | ICD-10-CM | POA: Insufficient documentation

## 2014-07-28 DIAGNOSIS — K2950 Unspecified chronic gastritis without bleeding: Secondary | ICD-10-CM | POA: Diagnosis not present

## 2014-07-28 DIAGNOSIS — E1121 Type 2 diabetes mellitus with diabetic nephropathy: Secondary | ICD-10-CM | POA: Insufficient documentation

## 2014-07-28 DIAGNOSIS — Z882 Allergy status to sulfonamides status: Secondary | ICD-10-CM | POA: Diagnosis not present

## 2014-07-28 DIAGNOSIS — K760 Fatty (change of) liver, not elsewhere classified: Secondary | ICD-10-CM | POA: Insufficient documentation

## 2014-07-28 DIAGNOSIS — Z87891 Personal history of nicotine dependence: Secondary | ICD-10-CM | POA: Insufficient documentation

## 2014-07-28 DIAGNOSIS — Z6831 Body mass index (BMI) 31.0-31.9, adult: Secondary | ICD-10-CM | POA: Insufficient documentation

## 2014-07-28 DIAGNOSIS — R162 Hepatomegaly with splenomegaly, not elsewhere classified: Secondary | ICD-10-CM | POA: Diagnosis not present

## 2014-07-28 DIAGNOSIS — K7689 Other specified diseases of liver: Secondary | ICD-10-CM | POA: Diagnosis not present

## 2014-07-28 DIAGNOSIS — E559 Vitamin D deficiency, unspecified: Secondary | ICD-10-CM | POA: Diagnosis not present

## 2014-07-28 DIAGNOSIS — E1140 Type 2 diabetes mellitus with diabetic neuropathy, unspecified: Secondary | ICD-10-CM | POA: Insufficient documentation

## 2014-07-28 HISTORY — PX: ESOPHAGOGASTRODUODENOSCOPY (EGD) WITH PROPOFOL: SHX5813

## 2014-07-28 HISTORY — DX: Sleep apnea, unspecified: G47.30

## 2014-07-28 HISTORY — PX: COLONOSCOPY WITH PROPOFOL: SHX5780

## 2014-07-28 HISTORY — DX: Essential (primary) hypertension: I10

## 2014-07-28 HISTORY — DX: Cardiac murmur, unspecified: R01.1

## 2014-07-28 LAB — GLUCOSE, CAPILLARY: Glucose-Capillary: 144 mg/dL — ABNORMAL HIGH (ref 70–99)

## 2014-07-28 SURGERY — ESOPHAGOGASTRODUODENOSCOPY (EGD) WITH PROPOFOL
Anesthesia: Monitor Anesthesia Care

## 2014-07-28 SURGERY — COLONOSCOPY WITH PROPOFOL
Anesthesia: Monitor Anesthesia Care

## 2014-07-28 MED ORDER — SODIUM CHLORIDE 0.9 % IV SOLN: INTRAVENOUS | Status: DC

## 2014-07-28 MED ORDER — LACTATED RINGERS IV SOLN
INTRAVENOUS | Status: DC
  Administered 2014-07-28: 1000 mL via INTRAVENOUS

## 2014-07-28 MED ORDER — PROPOFOL 10 MG/ML IV BOLUS
INTRAVENOUS | Status: AC
  Filled 2014-07-28: qty 20

## 2014-07-28 MED ORDER — PROPOFOL 10 MG/ML IV BOLUS
INTRAVENOUS | Status: DC | PRN
Start: 2014-07-28 — End: 2014-07-28
  Administered 2014-07-28 (×4): 50 mg via INTRAVENOUS
  Administered 2014-07-28: 25 mg via INTRAVENOUS
  Administered 2014-07-28 (×5): 50 mg via INTRAVENOUS
  Administered 2014-07-28: 25 mg via INTRAVENOUS

## 2014-07-28 MED ORDER — BUTAMBEN-TETRACAINE-BENZOCAINE 2-2-14 % EX AERO
INHALATION_SPRAY | CUTANEOUS | Status: DC | PRN
  Administered 2014-07-28: 1 via TOPICAL

## 2014-07-28 SURGICAL SUPPLY — 25 items

## 2014-07-28 NOTE — Discharge Instructions (Signed)
Colonoscopy, Care After Refer to this sheet in the next few weeks. These instructions provide you with information on caring for yourself after your procedure. Your health care provider may also give you more specific instructions. Your treatment has been planned according to current medical practices, but problems sometimes occur. Call your health care provider if you have any problems or questions after your procedure. WHAT TO EXPECT AFTER THE PROCEDURE  After your procedure, it is typical to have the following:  A small amount of blood in your stool.  Moderate amounts of gas and mild abdominal cramping or bloating. HOME CARE INSTRUCTIONS  Do not drive, operate machinery, or sign important documents for 24 hours.  You may shower and resume your regular physical activities, but move at a slower pace for the first 24 hours.  Take frequent rest periods for the first 24 hours.  Walk around or put a warm pack on your abdomen to help reduce abdominal cramping and bloating.  Drink enough fluids to keep your urine clear or pale yellow.  You may resume your normal diet as instructed by your health care provider. Avoid heavy or fried foods that are hard to digest.  Avoid drinking alcohol for 24 hours or as instructed by your health care provider.  Only take over-the-counter or prescription medicines as directed by your health care provider.  If a tissue sample (biopsy) was taken during your procedure:  Do not take aspirin or blood thinners for 7 days, or as instructed by your health care provider.  Do not drink alcohol for 7 days, or as instructed by your health care provider.  Eat soft foods for the first 24 hours. SEEK MEDICAL CARE IF: You have persistent spotting of blood in your stool 2-3 days after the procedure. SEEK IMMEDIATE MEDICAL CARE IF:  You have more than a small spotting of blood in your stool.  You pass large blood clots in your stool.  Your abdomen is swollen  (distended).  You have nausea or vomiting.  You have a fever.  You have increasing abdominal pain that is not relieved with medicine. Document Released: 03/29/2004 Document Revised: 06/05/2013 Document Reviewed: 04/22/2013 [facility] Patient Information 2015 [place], [place]. This information is not intended to replace advice given to you by your health care provider. Make sure you discuss any questions you have with your health care provider.  Esophagogastroduodenoscopy Care After Refer to this sheet in the next few weeks. These instructions provide you with information on caring for yourself after your procedure. Your caregiver may also give you more specific instructions. Your treatment has been planned according to current medical practices, but problems sometimes occur. Call your caregiver if you have any problems or questions after your procedure.  HOME CARE INSTRUCTIONS  Do not eat or drink anything until the numbing medicine (local anesthetic) has worn off and your gag reflex has returned. You will know that the local anesthetic has worn off when you can swallow comfortably.  Do not drive for 12 hours after the procedure or as directed by your caregiver.  Only take medicines as directed by your caregiver. SEEK MEDICAL CARE IF:   You cannot stop coughing.  You are not urinating at all or less than usual. SEEK IMMEDIATE MEDICAL CARE IF:  You have difficulty swallowing.  You cannot eat or drink.  You have worsening throat or chest pain.  You have dizziness, lightheadedness, or you faint.  You have nausea or vomiting.  You have chills.  You have  a fever.  You have severe abdominal pain.  You have black, tarry, or bloody stools.

## 2014-07-28 NOTE — Op Note (Signed)
Problems: Normocytic anemia. Colonoscopy with removal of a 5 mm sigmoid colon polyp performed in 2002; colon polyp pathology was not available. Normal surveillance colonoscopy performed in 2005. Normal esophagogastroduodenoscopy performed in 2007. Fatty appearing liver with course liver echotexture by abdominal ultrasound and abdominal CT scan. Normal liver enzymes on 05/30/2014. 3.1 cm gallstone.  Endoscopist: Earle Gell  Premedication: Propofol administered by anesthesia  Procedure: Diagnostic esophagogastroduodenoscopy The patient was placed in the left lateral decubitus position. The Pentax gastroscope was passed through the posterior hypopharynx into the proximal esophagus without difficulty. The vocal cords were not visualized.  Esophagoscopy: The proximal, mid, and lower segments of the esophageal mucosa appeared normal. The squamocolumnar junction was regular in appearance and noted at 45 cm from the incisor teeth. There was no endoscopic evidence for the presence of esophageal varices.  Gastroscopy: Retroflex view of the gastric cardia and fundus was normal. The gastric body appeared normal. In the proximal gastric antrum a 5 mm sessile polyp and a 3 mm sessile polyp were removed with the hot snare. An Endo Clip was applied to the 5 mm sessile polypectomy site to prevent bleeding. The pylorus appeared normal. No gastric varices were noted.  Duodenoscopy: The duodenal bulb and descending duodenum appeared normal.  Assessment: From the proximal gastric antrum, two small sessile polyps were removed with the hot snare. No esophagogastric varices were noted.  Procedure: Diagnostic colonoscopy Anal inspection and digital rectal exam were normal. The Pentax pediatric colonoscope was introduced into the rectum and advanced to the cecum. A normal-appearing appendiceal orifice and ileocecal valve were identified. Colonic preparation for the exam today was good. Withdrawal time was 16  minutes  Rectum. Normal. Retroflexed view of the distal rectum normal  Sigmoid colon. From the distal sigmoid colon a 1 cm pedunculated polyp was removed with the electrocautery snare; an Endo Clip was applied to the polypectomy site to prevent bleeding.  Descending colon. Normal  Splenic flexure. Normal  Transverse colon. Normal  Hepatic flexure. Normal  Ascending colon. Normal  Cecum and ileocecal valve. Normal  Assessment: A 1 cm pedunculated polyp was removed from the distal sigmoid colon; otherwise normal colonoscopy  Recommendation: If the sigmoid colon polyp returns neoplastic pathologically, the patient should undergo a surveillance colonoscopy in 5 years. If the polyp returns nonneoplastic pathologically, the patient should undergo a repeat screening colonoscopy in 10 years

## 2014-07-28 NOTE — Anesthesia Postprocedure Evaluation (Signed)
  Anesthesia Post-op Note  Patient: Javier Potter  Procedure(s) Performed: Procedure(s) (LRB): ESOPHAGOGASTRODUODENOSCOPY (EGD) WITH PROPOFOL (N/A) COLONOSCOPY WITH PROPOFOL (N/A)  Patient Location: PACU  Anesthesia Type: MAC  Level of Consciousness: awake and alert   Airway and Oxygen Therapy: Patient Spontanous Breathing  Post-op Pain: mild  Post-op Assessment: Post-op Vital signs reviewed, Patient's Cardiovascular Status Stable, Respiratory Function Stable, Patent Airway and No signs of Nausea or vomiting  Last Vitals:  Filed Vitals:   07/28/14 1150  BP: 123/43  Pulse: 69  Temp:   Resp: 15    Post-op Vital Signs: stable   Complications: No apparent anesthesia complications

## 2014-07-28 NOTE — H&P (Signed)
  Problems: Normocytic anemia. Colonoscopy with removal of a 5 mm sigmoid colon polyp performed in 2002; polyp pathology report was not available. Normal surveillance colonoscopy performed in 2005. Normal esophagogastroduodenoscopy performed in 2007. Fatty liver with gallstones diagnosed in 2005 ( 3.1 cm gallstone).  History: The patient is a 68 year old male born 01-22-1946.  Problem #1. Normocytic anemia. The patient chronically takes aspirin and ibuprofen. He denies hemoptysis, hematuria, hematemesis, or gastrointestinal bleeding. In 2002, he underwent a colonoscopy with removal of a small sigmoid colon polyp. He underwent a normal surveillance colonoscopy in 2005. He underwent a normal esophagogastroduodenoscopy in 2007. He takes iron on a daily basis. His serum iron saturation was 17% in 2013.  Problem #2. Obesity with type 2 diabetes mellitus and fatty appearing liver. The patient was diagnosed with fatty infiltration of the liver by abdominal ultrasound and abdominal CT scan. He has had mildly elevated liver transaminases in the past but his current hepatic profile showed normal liver enzymes. The patient is considering gastric bypass surgery to treat his obesity and type 2 diabetes mellitus. He requires large doses of insulin to control his blood sugar.  The patient is scheduled to undergo diagnostic colonoscopy and esophagogastroduodenoscopy.  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 Hypercholesterolemia. Hypertriglyceridemia. Hypertension. Microalbuminuria. Hypogonadism. Vitamin D deficiency. Vitamin B 12 deficiency. Obstructive sleep apnea syndrome. Depression. Insomnia. Sinus surgery. Tonsillectomy.  Exam: The patient is alert and lying comfortably on the endoscopy stretcher. Abdomen is soft and nontender to palpation. Lungs are clear to auscultation. Cardiac exam reveals a regular rhythm.  Plan: Proceed with diagnostic esophagogastroduodenoscopy  and colonoscopy.

## 2014-07-28 NOTE — Transfer of Care (Signed)
Immediate Anesthesia Transfer of Care Note  Patient: Javier Potter  Procedure(s) Performed: Procedure(s): ESOPHAGOGASTRODUODENOSCOPY (EGD) WITH PROPOFOL (N/A) COLONOSCOPY WITH PROPOFOL (N/A)  Patient Location: PACU  Anesthesia Type:MAC  Level of Consciousness: awake, sedated and patient cooperative  Airway & Oxygen Therapy: Patient Spontanous Breathing and Patient connected to face mask oxygen  Post-op Assessment: Report given to PACU RN and Post -op Vital signs reviewed and stable  Post vital signs: Reviewed and stable  Complications: No apparent anesthesia complications

## 2014-07-29 ENCOUNTER — Encounter (HOSPITAL_COMMUNITY): Payer: Self-pay | Admitting: Gastroenterology

## 2014-07-30 DIAGNOSIS — E538 Deficiency of other specified B group vitamins: Secondary | ICD-10-CM | POA: Diagnosis not present

## 2014-07-30 DIAGNOSIS — E291 Testicular hypofunction: Secondary | ICD-10-CM | POA: Diagnosis not present

## 2014-07-30 DIAGNOSIS — E1122 Type 2 diabetes mellitus with diabetic chronic kidney disease: Secondary | ICD-10-CM | POA: Diagnosis not present

## 2014-07-30 DIAGNOSIS — N183 Chronic kidney disease, stage 3 (moderate): Secondary | ICD-10-CM | POA: Diagnosis not present

## 2014-08-13 DIAGNOSIS — K8020 Calculus of gallbladder without cholecystitis without obstruction: Secondary | ICD-10-CM | POA: Diagnosis not present

## 2014-08-13 DIAGNOSIS — E669 Obesity, unspecified: Secondary | ICD-10-CM | POA: Diagnosis not present

## 2014-08-13 DIAGNOSIS — K760 Fatty (change of) liver, not elsewhere classified: Secondary | ICD-10-CM | POA: Diagnosis not present

## 2014-08-13 DIAGNOSIS — E291 Testicular hypofunction: Secondary | ICD-10-CM | POA: Diagnosis not present

## 2014-08-13 DIAGNOSIS — Z23 Encounter for immunization: Secondary | ICD-10-CM | POA: Diagnosis not present

## 2014-08-13 DIAGNOSIS — D649 Anemia, unspecified: Secondary | ICD-10-CM | POA: Diagnosis not present

## 2014-08-13 DIAGNOSIS — N183 Chronic kidney disease, stage 3 (moderate): Secondary | ICD-10-CM | POA: Diagnosis not present

## 2014-09-01 DIAGNOSIS — E291 Testicular hypofunction: Secondary | ICD-10-CM | POA: Diagnosis not present

## 2014-09-15 DIAGNOSIS — Z23 Encounter for immunization: Secondary | ICD-10-CM | POA: Diagnosis not present

## 2014-09-15 DIAGNOSIS — E291 Testicular hypofunction: Secondary | ICD-10-CM | POA: Diagnosis not present

## 2014-09-29 DIAGNOSIS — E538 Deficiency of other specified B group vitamins: Secondary | ICD-10-CM | POA: Diagnosis not present

## 2014-09-29 DIAGNOSIS — E291 Testicular hypofunction: Secondary | ICD-10-CM | POA: Diagnosis not present

## 2014-10-03 DIAGNOSIS — K8010 Calculus of gallbladder with chronic cholecystitis without obstruction: Secondary | ICD-10-CM | POA: Diagnosis not present

## 2014-10-13 DIAGNOSIS — E291 Testicular hypofunction: Secondary | ICD-10-CM | POA: Diagnosis not present

## 2014-10-27 DIAGNOSIS — E291 Testicular hypofunction: Secondary | ICD-10-CM | POA: Diagnosis not present

## 2014-11-04 NOTE — Progress Notes (Addendum)
Please put orders in Epic surgery 11-14-14 pre op 11-07-14 Thanks

## 2014-11-06 NOTE — Patient Instructions (Addendum)
Javier Potter  11/06/2014   Your procedure is scheduled on: Friday 11/14/2014  Report to [facility] Main  Entrance and follow signs to               Javier Potter at 0700 AM.  Call this number if you have problems the morning of surgery [phone]   Remember:  Do not eat food or drink liquids :After Midnight.     Take these medicines the morning of surgery with A SIP OF WATER: celexa, metoprolol, prilosec, crestor                               You may not have any metal on your body including hair pins and              piercings  Do not wear jewelry, make-up, lotions, powders or perfumes.             Do not wear nail polish.  Do not shave  48 hours prior to surgery.              Men may shave face and neck.  Do not bring valuables to the hospital. [place].  Contacts, dentures or bridgework may not be worn into surgery.     Patients discharged the day of surgery will not be allowed to drive home.  Name and phone number of your driver: Javier Potter [phone]     _____________________________________________________________________             [facility] - Preparing for Surgery Before surgery, you can play an important role.  Because skin is not sterile, your skin needs to be as free of germs as possible.  You can reduce the number of germs on your skin by washing with CHG (chlorahexidine gluconate) soap before surgery.  CHG is an antiseptic cleaner which kills germs and bonds with the skin to continue killing germs even after washing. Please DO NOT use if you have an allergy to CHG or antibacterial soaps.  If your skin becomes reddened/irritated stop using the CHG and inform your nurse when you arrive at Short Stay. Do not shave (including legs and underarms) for at least 48 hours prior to the first CHG shower.  You may shave your face/neck. Please follow these instructions carefully:  1.  Shower with CHG  Soap the night before surgery and the  morning of Surgery.  2.  If you choose to wash your hair, wash your hair first as usual with your  normal  shampoo.  3.  After you shampoo, rinse your hair and body thoroughly to remove the  shampoo.                           4.  Use CHG as you would any other liquid soap.  You can apply chg directly  to the skin and wash                       Gently with a scrungie or clean washcloth.  5.  Apply the CHG Soap to your body ONLY FROM THE NECK DOWN.   Do not use on face/ open  Wound or open sores. Avoid contact with eyes, ears mouth and genitals (private parts).                       Wash face,  Genitals (private parts) with your normal soap.             6.  Wash thoroughly, paying special attention to the area where your surgery  will be performed.  7.  Thoroughly rinse your body with warm water from the neck down.  8.  DO NOT shower/wash with your normal soap after using and rinsing off  the CHG Soap.                9.  Pat yourself dry with a clean towel.            10.  Wear clean pajamas.            11.  Place clean sheets on your bed the night of your first shower and do not  sleep with pets. Day of Surgery : Do not apply any lotions/deodorants the morning of surgery.  Please wear clean clothes to the hospital/surgery center.  FAILURE TO FOLLOW THESE INSTRUCTIONS MAY RESULT IN THE CANCELLATION OF YOUR SURGERY PATIENT SIGNATURE_________________________________  NURSE SIGNATURE__________________________________  ________________________________________________________________________

## 2014-11-06 NOTE — Progress Notes (Signed)
05/22/2014-EKG from [facility] on chart.

## 2014-11-07 ENCOUNTER — Encounter (HOSPITAL_COMMUNITY)
Admission: RE | Admit: 2014-11-07 | Discharge: 2014-11-07 | Disposition: A | Discharge status: Home / Self Care | Payer: Medicare Other | Source: Ambulatory Visit | Attending: Surgery | Admitting: Surgery

## 2014-11-07 ENCOUNTER — Encounter (HOSPITAL_COMMUNITY): Payer: Self-pay

## 2014-11-07 DIAGNOSIS — Z01812 Encounter for preprocedural laboratory examination: Secondary | ICD-10-CM | POA: Diagnosis not present

## 2014-11-07 DIAGNOSIS — K8020 Calculus of gallbladder without cholecystitis without obstruction: Secondary | ICD-10-CM | POA: Diagnosis not present

## 2014-11-07 HISTORY — DX: Anemia, unspecified: D64.9

## 2014-11-07 HISTORY — DX: Gastro-esophageal reflux disease without esophagitis: K21.9

## 2014-11-07 LAB — CBC
HCT: 43.6 % (ref 39.0–52.0)
HEMOGLOBIN: 13.5 g/dL (ref 13.0–17.0)
MCH: 28.9 pg (ref 26.0–34.0)
MCHC: 31 g/dL (ref 30.0–36.0)
MCV: 93.4 fL (ref 78.0–100.0)
PLATELETS: 116 10*3/uL — AB (ref 150–400)
RBC: 4.67 MIL/uL (ref 4.22–5.81)
RDW: 14.6 % (ref 11.5–15.5)
WBC: 5.6 10*3/uL (ref 4.0–10.5)

## 2014-11-07 LAB — BASIC METABOLIC PANEL
ANION GAP: 9 (ref 5–15)
BUN: 30 mg/dL — ABNORMAL HIGH (ref 6–23)
CALCIUM: 10.2 mg/dL (ref 8.4–10.5)
CHLORIDE: 105 mmol/L (ref 96–112)
CO2: 26 mmol/L (ref 19–32)
Creatinine, Ser: 1.3 mg/dL (ref 0.50–1.35)
GFR calc Af Amer: 64 mL/min — ABNORMAL LOW (ref >90)
GFR calc non Af Amer: 55 mL/min — ABNORMAL LOW (ref >90)
Glucose, Bld: 107 mg/dL — ABNORMAL HIGH (ref 70–99)
Potassium: 4.3 mmol/L (ref 3.5–5.1)
Sodium: 140 mmol/L (ref 135–145)

## 2014-11-07 NOTE — Progress Notes (Signed)
Please out orders in [facility] for surgery 11/14/14. Thank you

## 2014-11-10 DIAGNOSIS — E538 Deficiency of other specified B group vitamins: Secondary | ICD-10-CM | POA: Diagnosis not present

## 2014-11-10 DIAGNOSIS — E291 Testicular hypofunction: Secondary | ICD-10-CM | POA: Diagnosis not present

## 2014-11-13 ENCOUNTER — Ambulatory Visit: Payer: Self-pay | Admitting: Surgery

## 2014-11-13 NOTE — H&P (Signed)
Javier Potter 10/03/2014 10:55 AM Location: [facility] Patient #: [id number] DOB: September 16, 1945 Married / Language: Javier Potter / Race: White Male  History of Present Illness Javier Potter Done MD; 10/03/2014 12:24 PM) Patient words: gallbladder.  The patient is a 69 year Javier male who presents for evaluation of gall stones. The onset of the gall stones has been gradual. Javier Potter came in to discuss Javier Potter's 3 cm gallstone. This was found on ultrasound also showed no other stones as well. Javier Potter suffers from metabolic syndrome and has hyperlipidemia and type 2 diabetes and some centripetal obesity. With that he also has a prominent diastasis recti. It may be that his gallbladder has caused him to preferentially eliminate a lot of the protein foods from his diet and pushing more toward a higher carbohydrate diet making it harder to lose weight. He is trying to exercise. I discussed the rationale for a low carbohydrate high protein diet with him. I think this would benefit him greatly. This is something that he would have to decide to to do. I also discussed laparoscopic cholecystectomy with him in terms of his risk benefits including complications not limited to, gallbladder injury bleeding bile leaks etc. I gave him a pamphlet on this. He decided he would like to move forward with laparoscopic cholecystectomy.   Other Problems Javier Potter, CMA; 10/03/2014 10:55 AM) Cholelithiasis Diabetes Mellitus Diverticulosis Gastroesophageal Reflux Disease Heart murmur Hemorrhoids High blood pressure Hypercholesterolemia Sleep Apnea Umbilical Hernia Repair  Past Surgical History Javier Potter, CMA; 10/03/2014 10:55 AM) Colon Polyp Removal - Colonoscopy Tonsillectomy  Diagnostic Studies History Javier Potter, CMA; 10/03/2014 10:55 AM) Colonoscopy within last year  Allergies Javier Potter, CMA; 10/03/2014 10:57 AM) Ambien *HYPNOTICS/SEDATIVES/SLEEP DISORDER  AGENTS* Sulfa Antibiotics Augmentin *PENICILLINS*  Medication History (Javier Potter, CMA; 10/03/2014 10:58 AM) Accu-Chek Aviva Plus (In Vitro) Active. Citalopram Hydrobromide (40MG Tablet, Oral) Active. Crestor (10MG Tablet, Oral) Active. Fenofibrate (160MG Tablet, Oral) Active. Lantus SoloStar (100UNIT/ML Soln Pen-inj, Subcutaneous) Active. Lisinopril (20MG Tablet, Oral) Active. Metoprolol Succinate ER (50MG Tablet ER 24HR, Oral) Active. NovoLOG FlexPen (100UNIT/ML Soln Pen-inj, Subcutaneous) Active. Omeprazole (20MG Capsule DR, Oral) Active. Potassium Chloride Crys ER (20MEQ Tablet ER, Oral) Active. Invokamet (50-1000MG Tablet, Oral) Active. Accu-Chek Aviva Plus (w/Device Kit,) Active.  Social History (Javier Potter; 10/03/2014 10:55 AM) Alcohol use Occasional alcohol use. Caffeine use Coffee. No drug use Tobacco use Former smoker.  Family History Javier Potter, [place]; 10/03/2014 10:55 AM) Alcohol Abuse Father, Mother. Arthritis Father, Sister. Cancer Father. Heart Disease Father. Heart disease in male family member before age 29 Heart disease in male family member before age 56 Kidney Disease Mother.  Review of Systems (Javier Potter; 10/03/2014 10:55 AM) General Present- Night Sweats and Weight Gain. Not Present- Appetite Loss, Chills, Fatigue, Fever and Weight Loss. Skin Not Present- Change in Wart/Mole, Dryness, Hives, Jaundice, New Lesions, Non-Healing Wounds, Rash and Ulcer. HEENT Not Present- Earache, Hearing Loss, Hoarseness, Nose Bleed, Oral Ulcers, Ringing in the Ears, Seasonal Allergies, Sinus Pain, Sore Throat, Visual Disturbances, Wears glasses/contact lenses and Yellow Eyes. Respiratory Present- Snoring. Not Present- Bloody sputum, Chronic Cough, Difficulty Breathing and Wheezing. Breast Not Present- Breast Mass, Breast Pain, Nipple Discharge and Skin Changes. Cardiovascular Present- Leg Cramps. Not Present- Chest Pain, Difficulty Breathing  Lying Down, Palpitations, Rapid Heart Rate, Shortness of Breath and Swelling of Extremities. Gastrointestinal Present- Excessive gas and Hemorrhoids. Not Present- Abdominal Pain, Bloating, Bloody Stool, Change in Bowel Habits, Chronic diarrhea, Constipation, Difficulty Swallowing, Gets full  quickly at meals, Indigestion, Nausea, Rectal Pain and Vomiting. Male Genitourinary Present- Frequency, Nocturia and Urgency. Not Present- Blood in Urine, Change in Urinary Stream, Impotence, Painful Urination and Urine Leakage. Musculoskeletal Present- Joint Stiffness. Not Present- Back Pain, Joint Pain, Muscle Pain, Muscle Weakness and Swelling of Extremities. Neurological Not Present- Decreased Memory, Fainting, Headaches, Numbness, Seizures, Tingling, Tremor, Trouble walking and Weakness. Psychiatric Not Present- Anxiety, Bipolar, Change in Sleep Pattern, Depression, Fearful and Frequent crying. Hematology Not Present- Easy Bruising, Excessive bleeding, Gland problems, HIV and Persistent Infections.   Vitals (Javier Potter CMA; 10/03/2014 10:56 AM) 10/03/2014 10:56 AM Weight: 202 lb Height: 66in Body Surface Area: 2.07 m Body Mass Index: 32.6 kg/m Temp.: 98.28F(Temporal)  Pulse: 76 (Regular)  BP: 132/72 (Sitting, Left Arm, Standard)    Physical Exam (Javier Potter Done MD; 10/03/2014 12:26 PM) The physical exam findings are as follows: Note:WDmildly obese WM NAD HEENT-no icterus Neck no bruits Chest clear Heart SR without murmurs Abdomen diastasis recti GU not examined Ext FROM without edema; no hx of DVT Neuro alert and oriented x 3    Assessment & Plan Javier Potter Done MD; 10/03/2014 12:27 PM) CHRONIC CHOLECYSTITIS WITH CALCULUS (574.10  K80.10) Impression: >3 cm gallstone. wil lap chole IOC

## 2014-11-14 ENCOUNTER — Ambulatory Visit (HOSPITAL_COMMUNITY): Payer: Medicare Other

## 2014-11-14 ENCOUNTER — Ambulatory Visit (HOSPITAL_COMMUNITY): Payer: Medicare Other | Admitting: Registered Nurse

## 2014-11-14 ENCOUNTER — Encounter (HOSPITAL_COMMUNITY): Payer: Self-pay | Admitting: *Deleted

## 2014-11-14 ENCOUNTER — Encounter (HOSPITAL_COMMUNITY)
Admission: RE | Disposition: A | Discharge status: Home / Self Care | Payer: Self-pay | Source: Ambulatory Visit | Attending: Surgery

## 2014-11-14 ENCOUNTER — Ambulatory Visit: Payer: Self-pay | Admitting: Surgery

## 2014-11-14 ENCOUNTER — Observation Stay (HOSPITAL_COMMUNITY)
Admission: RE | Admit: 2014-11-14 | Discharge: 2014-11-15 | Disposition: A | Discharge status: Home / Self Care | Payer: Medicare Other | Source: Ambulatory Visit | Attending: Surgery | Admitting: Surgery

## 2014-11-14 DIAGNOSIS — F1590 Other stimulant use, unspecified, uncomplicated: Secondary | ICD-10-CM | POA: Insufficient documentation

## 2014-11-14 DIAGNOSIS — D649 Anemia, unspecified: Secondary | ICD-10-CM | POA: Diagnosis not present

## 2014-11-14 DIAGNOSIS — Z86010 Personal history of colonic polyps: Secondary | ICD-10-CM | POA: Insufficient documentation

## 2014-11-14 DIAGNOSIS — Z87891 Personal history of nicotine dependence: Secondary | ICD-10-CM | POA: Diagnosis not present

## 2014-11-14 DIAGNOSIS — F1099 Alcohol use, unspecified with unspecified alcohol-induced disorder: Secondary | ICD-10-CM | POA: Insufficient documentation

## 2014-11-14 DIAGNOSIS — N189 Chronic kidney disease, unspecified: Secondary | ICD-10-CM | POA: Diagnosis not present

## 2014-11-14 DIAGNOSIS — E785 Hyperlipidemia, unspecified: Secondary | ICD-10-CM | POA: Insufficient documentation

## 2014-11-14 DIAGNOSIS — K7469 Other cirrhosis of liver: Principal | ICD-10-CM | POA: Insufficient documentation

## 2014-11-14 DIAGNOSIS — K219 Gastro-esophageal reflux disease without esophagitis: Secondary | ICD-10-CM | POA: Diagnosis not present

## 2014-11-14 DIAGNOSIS — Z888 Allergy status to other drugs, medicaments and biological substances status: Secondary | ICD-10-CM | POA: Insufficient documentation

## 2014-11-14 DIAGNOSIS — Z880 Allergy status to penicillin: Secondary | ICD-10-CM | POA: Insufficient documentation

## 2014-11-14 DIAGNOSIS — Z9049 Acquired absence of other specified parts of digestive tract: Secondary | ICD-10-CM

## 2014-11-14 DIAGNOSIS — Z882 Allergy status to sulfonamides status: Secondary | ICD-10-CM | POA: Insufficient documentation

## 2014-11-14 DIAGNOSIS — E119 Type 2 diabetes mellitus without complications: Secondary | ICD-10-CM | POA: Diagnosis not present

## 2014-11-14 DIAGNOSIS — I10 Essential (primary) hypertension: Secondary | ICD-10-CM | POA: Insufficient documentation

## 2014-11-14 DIAGNOSIS — K8020 Calculus of gallbladder without cholecystitis without obstruction: Secondary | ICD-10-CM

## 2014-11-14 DIAGNOSIS — K8010 Calculus of gallbladder with chronic cholecystitis without obstruction: Secondary | ICD-10-CM | POA: Diagnosis not present

## 2014-11-14 HISTORY — PX: CHOLECYSTECTOMY: SHX55

## 2014-11-14 LAB — GLUCOSE, CAPILLARY
GLUCOSE-CAPILLARY: 133 mg/dL — AB (ref 70–99)
GLUCOSE-CAPILLARY: 169 mg/dL — AB (ref 70–99)
Glucose-Capillary: 195 mg/dL — ABNORMAL HIGH (ref 70–99)
Glucose-Capillary: 196 mg/dL — ABNORMAL HIGH (ref 70–99)
Glucose-Capillary: 85 mg/dL (ref 70–99)

## 2014-11-14 SURGERY — LAPAROSCOPIC CHOLECYSTECTOMY WITH INTRAOPERATIVE CHOLANGIOGRAM
Anesthesia: General | Site: Abdomen

## 2014-11-14 MED ORDER — ROCURONIUM BROMIDE 100 MG/10ML IV SOLN
INTRAVENOUS | Status: DC | PRN
  Administered 2014-11-14: 35 mg via INTRAVENOUS
  Administered 2014-11-14: 5 mg via INTRAVENOUS

## 2014-11-14 MED ORDER — MORPHINE SULFATE 2 MG/ML IJ SOLN
1.0000 mg | INTRAMUSCULAR | Status: DC | PRN
  Administered 2014-11-14 (×2): 1 mg via INTRAVENOUS
  Filled 2014-11-14: qty 1

## 2014-11-14 MED ORDER — ROCURONIUM BROMIDE 100 MG/10ML IV SOLN
INTRAVENOUS | Status: AC
  Filled 2014-11-14: qty 1

## 2014-11-14 MED ORDER — ONDANSETRON HCL 4 MG/2ML IJ SOLN
4.0000 mg | Freq: Four times a day (QID) | INTRAMUSCULAR | Status: DC | PRN
Start: 2014-11-14 — End: 2014-11-15

## 2014-11-14 MED ORDER — ONDANSETRON HCL 4 MG PO TABS: 4.0000 mg | ORAL_TABLET | Freq: Four times a day (QID) | ORAL | Status: DC | PRN

## 2014-11-14 MED ORDER — ONDANSETRON HCL 4 MG/2ML IJ SOLN
INTRAMUSCULAR | Status: DC | PRN
  Administered 2014-11-14: 4 mg via INTRAVENOUS

## 2014-11-14 MED ORDER — DEXAMETHASONE SODIUM PHOSPHATE 10 MG/ML IJ SOLN
INTRAMUSCULAR | Status: DC | PRN
  Administered 2014-11-14: 10 mg via INTRAVENOUS

## 2014-11-14 MED ORDER — MEPERIDINE HCL 50 MG/ML IJ SOLN: 6.2500 mg | INTRAMUSCULAR | Status: DC | PRN

## 2014-11-14 MED ORDER — PROPOFOL 10 MG/ML IV BOLUS
INTRAVENOUS | Status: DC | PRN
  Administered 2014-11-14: 160 mg via INTRAVENOUS

## 2014-11-14 MED ORDER — HEPARIN SODIUM (PORCINE) 5000 UNIT/ML IJ SOLN
5000.0000 [IU] | Freq: Once | INTRAMUSCULAR | Status: AC
  Administered 2014-11-14: 5000 [IU] via SUBCUTANEOUS
  Filled 2014-11-14: qty 1

## 2014-11-14 MED ORDER — FENTANYL CITRATE 0.05 MG/ML IJ SOLN
25.0000 ug | INTRAMUSCULAR | Status: DC | PRN
  Administered 2014-11-14 (×4): 25 ug via INTRAVENOUS

## 2014-11-14 MED ORDER — LACTATED RINGERS IV SOLN
INTRAVENOUS | Status: DC
  Administered 2014-11-14: 1000 mL via INTRAVENOUS

## 2014-11-14 MED ORDER — OXYCODONE HCL 5 MG PO TABS: 5.0000 mg | ORAL_TABLET | ORAL | Status: DC | PRN

## 2014-11-14 MED ORDER — ONDANSETRON HCL 4 MG/2ML IJ SOLN: 4.0000 mg | Freq: Once | INTRAMUSCULAR | Status: DC | PRN

## 2014-11-14 MED ORDER — CHLORHEXIDINE GLUCONATE 4 % EX LIQD
1.0000 | Freq: Once | CUTANEOUS | Status: DC
Start: 2014-11-15 — End: 2014-11-14

## 2014-11-14 MED ORDER — LACTATED RINGERS IV SOLN
INTRAVENOUS | Status: DC | PRN
  Administered 2014-11-14 (×2): via INTRAVENOUS

## 2014-11-14 MED ORDER — CEFAZOLIN SODIUM-DEXTROSE 2-3 GM-% IV SOLR
2.0000 g | INTRAVENOUS | Status: AC
  Administered 2014-11-14: 2 g via INTRAVENOUS

## 2014-11-14 MED ORDER — MIDAZOLAM HCL 5 MG/5ML IJ SOLN
INTRAMUSCULAR | Status: DC | PRN
  Administered 2014-11-14: 1 mg via INTRAVENOUS

## 2014-11-14 MED ORDER — ONDANSETRON HCL 4 MG/2ML IJ SOLN
INTRAMUSCULAR | Status: AC
  Filled 2014-11-14: qty 2

## 2014-11-14 MED ORDER — ACETAMINOPHEN 10 MG/ML IV SOLN
INTRAVENOUS | Status: DC | PRN
  Administered 2014-11-14: 1000 mg via INTRAVENOUS

## 2014-11-14 MED ORDER — IOHEXOL 300 MG/ML  SOLN
INTRAMUSCULAR | Status: DC | PRN
  Administered 2014-11-14: 5 mL

## 2014-11-14 MED ORDER — FENTANYL CITRATE 0.05 MG/ML IJ SOLN
INTRAMUSCULAR | Status: AC
  Filled 2014-11-14: qty 5

## 2014-11-14 MED ORDER — KCL IN DEXTROSE-NACL 20-5-0.45 MEQ/L-%-% IV SOLN
INTRAVENOUS | Status: DC
  Administered 2014-11-14: 15:00:00 via INTRAVENOUS
  Administered 2014-11-15: 100 mL/h via INTRAVENOUS
  Filled 2014-11-14 (×3): qty 1000

## 2014-11-14 MED ORDER — FENTANYL CITRATE 0.05 MG/ML IJ SOLN
INTRAMUSCULAR | Status: DC | PRN
  Administered 2014-11-14: 100 ug via INTRAVENOUS
  Administered 2014-11-14: 50 ug via INTRAVENOUS

## 2014-11-14 MED ORDER — PHENYLEPHRINE HCL 10 MG/ML IJ SOLN
INTRAMUSCULAR | Status: DC | PRN
  Administered 2014-11-14 (×3): 80 ug via INTRAVENOUS

## 2014-11-14 MED ORDER — NEOSTIGMINE METHYLSULFATE 10 MG/10ML IV SOLN
INTRAVENOUS | Status: DC | PRN
  Administered 2014-11-14: 4 mg via INTRAVENOUS

## 2014-11-14 MED ORDER — ACETAMINOPHEN 10 MG/ML IV SOLN
1000.0000 mg | Freq: Once | INTRAVENOUS | Status: DC
  Filled 2014-11-14 (×2): qty 100

## 2014-11-14 MED ORDER — MORPHINE SULFATE 2 MG/ML IJ SOLN
INTRAMUSCULAR | Status: AC
  Filled 2014-11-14: qty 1

## 2014-11-14 MED ORDER — GLYCOPYRROLATE 0.2 MG/ML IJ SOLN
INTRAMUSCULAR | Status: DC | PRN
  Administered 2014-11-14: .8 mg via INTRAVENOUS

## 2014-11-14 MED ORDER — 0.9 % SODIUM CHLORIDE (POUR BTL) OPTIME
TOPICAL | Status: DC | PRN
  Administered 2014-11-14: 5 mL

## 2014-11-14 MED ORDER — LACTATED RINGERS IR SOLN
Status: DC | PRN
  Administered 2014-11-14: 1000 mL

## 2014-11-14 MED ORDER — METOPROLOL SUCCINATE ER 50 MG PO TB24
50.0000 mg | ORAL_TABLET | Freq: Every morning | ORAL | Status: DC
  Filled 2014-11-14: qty 1

## 2014-11-14 MED ORDER — INSULIN ASPART 100 UNIT/ML ~~LOC~~ SOLN
0.0000 [IU] | SUBCUTANEOUS | Status: DC
Start: 2014-11-14 — End: 2014-11-15
  Administered 2014-11-14 – 2014-11-15 (×5): 4 [IU] via SUBCUTANEOUS

## 2014-11-14 MED ORDER — FENTANYL CITRATE 0.05 MG/ML IJ SOLN
INTRAMUSCULAR | Status: AC
  Filled 2014-11-14: qty 2

## 2014-11-14 MED ORDER — LIDOCAINE HCL (CARDIAC) 20 MG/ML IV SOLN
INTRAVENOUS | Status: AC
  Filled 2014-11-14: qty 5

## 2014-11-14 MED ORDER — PROPOFOL 10 MG/ML IV BOLUS
INTRAVENOUS | Status: AC
  Filled 2014-11-14: qty 20

## 2014-11-14 MED ORDER — SUCCINYLCHOLINE CHLORIDE 20 MG/ML IJ SOLN
INTRAMUSCULAR | Status: DC | PRN
  Administered 2014-11-14: 100 mg via INTRAVENOUS

## 2014-11-14 MED ORDER — BUPIVACAINE LIPOSOME 1.3 % IJ SUSP
20.0000 mL | Freq: Once | INTRAMUSCULAR | Status: AC
  Administered 2014-11-14: 20 mL
  Filled 2014-11-14: qty 20

## 2014-11-14 MED ORDER — MIDAZOLAM HCL 2 MG/2ML IJ SOLN
INTRAMUSCULAR | Status: AC
  Filled 2014-11-14: qty 2

## 2014-11-14 MED ORDER — CEFAZOLIN SODIUM-DEXTROSE 2-3 GM-% IV SOLR
INTRAVENOUS | Status: AC
  Filled 2014-11-14: qty 50

## 2014-11-14 MED ORDER — LIDOCAINE HCL (CARDIAC) 20 MG/ML IV SOLN
INTRAVENOUS | Status: DC | PRN
  Administered 2014-11-14: 25 mg via INTRATRACHEAL
  Administered 2014-11-14: 75 mg via INTRAVENOUS

## 2014-11-14 SURGICAL SUPPLY — 37 items
APL SKNCLS STERI-STRIP NONHPOA (GAUZE/BANDAGES/DRESSINGS)
APPLIER CLIP ROT 10 11.4 M/L (STAPLE) ×3
APR CLP MED LRG 11.4X10 (STAPLE) ×1
BAG SPEC RTRVL 10 TROC 200 (ENDOMECHANICALS) ×1
BENZOIN TINCTURE PRP APPL 2/3 (GAUZE/BANDAGES/DRESSINGS) IMPLANT
CABLE HIGH FREQUENCY MONO STRZ (ELECTRODE) IMPLANT
CATH REDDICK CHOLANGI 4FR 50CM (CATHETERS) ×3 IMPLANT
CLIP APPLIE ROT 10 11.4 M/L (STAPLE) ×1 IMPLANT
CLOSURE WOUND 1/2 X4 (GAUZE/BANDAGES/DRESSINGS)
COVER MAYO STAND STRL (DRAPES) ×3 IMPLANT
DECANTER SPIKE VIAL GLASS SM (MISCELLANEOUS) ×1 IMPLANT
DRAPE C-ARM 42X120 X-RAY (DRAPES) ×3 IMPLANT
DRAPE LAPAROSCOPIC ABDOMINAL (DRAPES) ×3 IMPLANT
ELECT REM PT RETURN 9FT ADLT (ELECTROSURGICAL) ×3
ELECTRODE REM PT RTRN 9FT ADLT (ELECTROSURGICAL) ×1 IMPLANT
GLOVE BIOGEL M 8.0 STRL (GLOVE) ×3 IMPLANT
GOWN STRL REUS W/TWL XL LVL3 (GOWN DISPOSABLE) ×12 IMPLANT
HEMOSTAT SNOW SURGICEL 2X4 (HEMOSTASIS) ×2 IMPLANT
HEMOSTAT SURGICEL 4X8 (HEMOSTASIS) IMPLANT
IV CATH 14GX2 1/4 (CATHETERS) ×3 IMPLANT
KIT BASIN OR (CUSTOM PROCEDURE TRAY) ×3 IMPLANT
LIQUID BAND (GAUZE/BANDAGES/DRESSINGS) ×3 IMPLANT
POUCH RETRIEVAL ECOSAC 10 (ENDOMECHANICALS) IMPLANT
POUCH RETRIEVAL ECOSAC 10MM (ENDOMECHANICALS) ×2
SCISSORS LAP 5X45 EPIX DISP (ENDOMECHANICALS) ×3 IMPLANT
SCRUB PCMX 4 OZ (MISCELLANEOUS) ×3 IMPLANT
SET IRRIG TUBING LAPAROSCOPIC (IRRIGATION / IRRIGATOR) ×5 IMPLANT
SLEEVE XCEL OPT CAN 5 100 (ENDOMECHANICALS) ×6 IMPLANT
STAPLER VISISTAT 35W (STAPLE) ×2 IMPLANT
STRIP CLOSURE SKIN 1/2X4 (GAUZE/BANDAGES/DRESSINGS) IMPLANT
SUT VIC AB 4-0 SH 18 (SUTURE) ×3 IMPLANT
SYR 20CC LL (SYRINGE) ×3 IMPLANT
TOWEL OR 17X26 10 PK STRL BLUE (TOWEL DISPOSABLE) ×3 IMPLANT
TRAY LAPAROSCOPIC (CUSTOM PROCEDURE TRAY) ×3 IMPLANT
TROCAR BLADELESS OPT 5 100 (ENDOMECHANICALS) ×3 IMPLANT
TROCAR XCEL BLUNT TIP 100MML (ENDOMECHANICALS) IMPLANT
TROCAR XCEL NON-BLD 11X100MML (ENDOMECHANICALS) ×3 IMPLANT

## 2014-11-14 NOTE — Anesthesia Postprocedure Evaluation (Signed)
Anesthesia Post Note  Patient: Javier Potter  Procedure(s) Performed: Procedure(s) (LRB): LAPAROSCOPIC CHOLECYSTECTOMY WITH INTRAOPERATIVE CHOLANGIOGRAM (N/A)  Anesthesia type: General  Patient location: PACU  Post pain: Pain level controlled  Post assessment: Post-op Vital signs reviewed  Last Vitals:  Filed Vitals:   11/14/14 1125  BP: 127/51  Pulse:   Temp:   Resp:     Post vital signs: Reviewed  Level of consciousness: sedated  Complications: No apparent anesthesia complications

## 2014-11-14 NOTE — Op Note (Signed)
[name] @date @  Procedure: Laparoscopic Cholecystectomy with intraoperative cholangiogram  Surgeon: Kaylyn Lim, MD, FACS Asst:  Jackolyn Confer, MD, FACS  Anes:  General  Drains: None  Findings: Macronodular cirrhosis, chronic cholecystitis with large mass of pigment stones, normal IOC  Description of Procedure: The patient was taken to OR 1 and given general anesthesia.  The patient was prepped with PCMX and draped sterilely. A time out was performed.  Access to the abdomen was achieved with a 5 mm Optiview through the right upper quadrant.  Port placement included three 5 mm trocars and one 12 mm trocar in the upper midline.    The gallbladder was visualized and the fundus was grasped and the gallbladder was elevated. Traction on the infundibulum allowed for successful demonstration of the critical view. Inflammatory changes were chronic but the macronodular cirrhosis was severe.  The cystic duct was identified and clipped up on the gallbladder and an incision was made in the cystic duct and the Reddick catheter was inserted after milking the cystic duct of any debris. A dynamic cholangiogram was performed which demonstrated intrahepatic filling and free flow into the duodenum.    The cystic duct was then triple clipped and divided, the cystic artery was double clipped and divided and then the gallbladder was removed from the gallbladder bed. Removal of the gallbladder from the gallbladder bed was tedious due to the cirrhosis.  Care was taken to clip vascular branches and then snow was placed in the gallbladder bed.  The gallbladder was then placed in a bag and brought out through one of the 10 mm trocar sites. The gallbladder bed was inspected and no bleeding or bile leaks were seen.   Laparoscopic visualization was used when closing fascial defects for trocar sites.   Incisions were injected with Exparel and closed with 4-0 Vicryl and Liquiban on the skin.  Sponge and needle count were  correct.     The patient was taken to the recovery room in satisfactory condition.

## 2014-11-14 NOTE — Anesthesia Preprocedure Evaluation (Signed)
Anesthesia Evaluation  Patient identified by MRN, date of birth, ID band Patient awake    Reviewed: Allergy & Precautions, NPO status , Patient's Chart, lab work & pertinent test results  Airway Mallampati: II       Dental   Pulmonary former smoker,    Pulmonary exam normal       Cardiovascular hypertension,     Neuro/Psych    GI/Hepatic GERD-  Medicated and Controlled,  Endo/Other  diabetes  Renal/GU      Musculoskeletal   Abdominal Normal abdominal exam  (+)   Peds  Hematology   Anesthesia Other Findings   Reproductive/Obstetrics                             Anesthesia Physical Anesthesia Plan  ASA: II  Anesthesia Plan: General   Post-op Pain Management:    Induction: Intravenous  Airway Management Planned: Oral ETT  Additional Equipment:   Intra-op Plan:   Post-operative Plan: Extubation in OR  Informed Consent: I have reviewed the patients History and Physical, chart, labs and discussed the procedure including the risks, benefits and alternatives for the proposed anesthesia with the patient or authorized representative who has indicated his/her understanding and acceptance.   Dental advisory given  Plan Discussed with:   Anesthesia Plan Comments:         Anesthesia Quick Evaluation

## 2014-11-14 NOTE — H&P (View-Only) (Signed)
Javier Potter 10/03/2014 10:55 AM Location: [facility] Patient #: [id number] DOB: 02/28/1946 Married / Language: English / Race: White Male  History of Present Illness ([name] B. [name] MD; 10/03/2014 12:24 PM) Patient words: gallbladder.  The patient is a 69 year old male who presents for evaluation of gall stones. The onset of the gall stones has been gradual. Mr. and Mrs. Wernert came in to discuss [name]'s 3 cm gallstone. This was found on ultrasound also showed no other stones as well. [name] suffers from metabolic syndrome and has hyperlipidemia and type 2 diabetes and some centripetal obesity. With that he also has a prominent diastasis recti. It may be that his gallbladder has caused him to preferentially eliminate a lot of the protein foods from his diet and pushing more toward a higher carbohydrate diet making it harder to lose weight. He is trying to exercise. I discussed the rationale for a low carbohydrate high protein diet with him. I think this would benefit him greatly. This is something that he would have to decide to to do. I also discussed laparoscopic cholecystectomy with him in terms of his risk benefits including complications not limited to, gallbladder injury bleeding bile leaks etc. I gave him a pamphlet on this. He decided he would like to move forward with laparoscopic cholecystectomy.   Other Problems ([name], CMA; 10/03/2014 10:55 AM) Cholelithiasis Diabetes Mellitus Diverticulosis Gastroesophageal Reflux Disease Heart murmur Hemorrhoids High blood pressure Hypercholesterolemia Sleep Apnea Umbilical Hernia Repair  Past Surgical History ([name], CMA; 10/03/2014 10:55 AM) Colon Polyp Removal - Colonoscopy Tonsillectomy  Diagnostic Studies History ([name], CMA; 10/03/2014 10:55 AM) Colonoscopy within last year  Allergies ([name], CMA; 10/03/2014 10:57 AM) Ambien *HYPNOTICS/SEDATIVES/SLEEP DISORDER  AGENTS* Sulfa Antibiotics Augmentin *PENICILLINS*  Medication History ([name], CMA; 10/03/2014 10:58 AM) Accu-Chek Aviva Plus (In Vitro) Active. Citalopram Hydrobromide (40MG Tablet, Oral) Active. Crestor (10MG Tablet, Oral) Active. Fenofibrate (160MG Tablet, Oral) Active. Lantus SoloStar (100UNIT/ML Soln Pen-inj, Subcutaneous) Active. Lisinopril (20MG Tablet, Oral) Active. Metoprolol Succinate ER (50MG Tablet ER 24HR, Oral) Active. NovoLOG FlexPen (100UNIT/ML Soln Pen-inj, Subcutaneous) Active. Omeprazole (20MG Capsule DR, Oral) Active. Potassium Chloride Crys ER (20MEQ Tablet ER, Oral) Active. Invokamet (50-1000MG Tablet, Oral) Active. Accu-Chek Aviva Plus (w/Device Kit,) Active.  Social History ([name], CMA; 10/03/2014 10:55 AM) Alcohol use Occasional alcohol use. Caffeine use Coffee. No drug use Tobacco use Former smoker.  Family History ([name], CMA; 10/03/2014 10:55 AM) Alcohol Abuse Father, Mother. Arthritis Father, Sister. Cancer Father. Heart Disease Father. Heart disease in male family member before age 65 Heart disease in male family member before age 55 Kidney Disease Mother.  Review of Systems ([name] CMA; 10/03/2014 10:55 AM) General Present- Night Sweats and Weight Gain. Not Present- Appetite Loss, Chills, Fatigue, Fever and Weight Loss. Skin Not Present- Change in Wart/Mole, Dryness, Hives, Jaundice, New Lesions, Non-Healing Wounds, Rash and Ulcer. HEENT Not Present- Earache, Hearing Loss, Hoarseness, Nose Bleed, Oral Ulcers, Ringing in the Ears, Seasonal Allergies, Sinus Pain, Sore Throat, Visual Disturbances, Wears glasses/contact lenses and Yellow Eyes. Respiratory Present- Snoring. Not Present- Bloody sputum, Chronic Cough, Difficulty Breathing and Wheezing. Breast Not Present- Breast Mass, Breast Pain, Nipple Discharge and Skin Changes. Cardiovascular Present- Leg Cramps. Not Present- Chest Pain, Difficulty Breathing  Lying Down, Palpitations, Rapid Heart Rate, Shortness of Breath and Swelling of Extremities. Gastrointestinal Present- Excessive gas and Hemorrhoids. Not Present- Abdominal Pain, Bloating, Bloody Stool, Change in Bowel Habits, Chronic diarrhea, Constipation, Difficulty Swallowing, Gets full   quickly at meals, Indigestion, Nausea, Rectal Pain and Vomiting. Male Genitourinary Present- Frequency, Nocturia and Urgency. Not Present- Blood in Urine, Change in Urinary Stream, Impotence, Painful Urination and Urine Leakage. Musculoskeletal Present- Joint Stiffness. Not Present- Back Pain, Joint Pain, Muscle Pain, Muscle Weakness and Swelling of Extremities. Neurological Not Present- Decreased Memory, Fainting, Headaches, Numbness, Seizures, Tingling, Tremor, Trouble walking and Weakness. Psychiatric Not Present- Anxiety, Bipolar, Change in Sleep Pattern, Depression, Fearful and Frequent crying. Hematology Not Present- Easy Bruising, Excessive bleeding, Gland problems, HIV and Persistent Infections.   Vitals ([name] CMA; 10/03/2014 10:56 AM) 10/03/2014 10:56 AM Weight: 202 lb Height: 66in Body Surface Area: 2.07 m Body Mass Index: 32.6 kg/m Temp.: 98.33F(Temporal)  Pulse: 76 (Regular)  BP: 132/72 (Sitting, Left Arm, Standard)    Physical Exam ([name] Done MD; 10/03/2014 12:26 PM) The physical exam findings are as follows: Note:WDmildly obese WM NAD HEENT-no icterus Neck no bruits Chest clear Heart SR without murmurs Abdomen diastasis recti GU not examined Ext FROM without edema; no hx of DVT Neuro alert and oriented x 3    Assessment & Plan [name] B. Hassell Done MD; 10/03/2014 12:27 PM) CHRONIC CHOLECYSTITIS WITH CALCULUS (574.10  K80.10) Impression: >3 cm gallstone. wil lap chole IOC

## 2014-11-14 NOTE — Transfer of Care (Signed)
Immediate Anesthesia Transfer of Care Note  Patient: BRINLEY LEINER  Procedure(s) Performed: Procedure(s): LAPAROSCOPIC CHOLECYSTECTOMY WITH INTRAOPERATIVE CHOLANGIOGRAM (N/A)  Patient Location: PACU  Anesthesia Type:General  Level of Consciousness: awake, alert , oriented, sedated, unresponsive and patient cooperative  Airway & Oxygen Therapy: Patient Spontanous Breathing, Patient connected to nasal cannula oxygen and Patient connected to face mask oxygen  Post-op Assessment: Report given to RN, Post -op Vital signs reviewed and stable, Post -op Vital signs reviewed and unstable, Anesthesiologist notified, Patient moving all extremities and Patient moving all extremities X 4  Post vital signs: stable  Last Vitals:  Filed Vitals:   11/14/14 0702  BP: 148/63  Pulse: 70  Temp: 36.9 C  Resp: 18    Complications: No apparent anesthesia complications

## 2014-11-14 NOTE — Progress Notes (Signed)
CBG 133 

## 2014-11-14 NOTE — Interval H&P Note (Signed)
History and Physical Interval Note:  11/14/2014 9:01 AM  Javier Potter  has presented today for surgery, with the diagnosis of GALLSTONES  The various methods of treatment have been discussed with the patient and family. After consideration of risks, benefits and other options for treatment, the patient has consented to  Procedure(s): LAPAROSCOPIC CHOLECYSTECTOMY WITH INTRAOPERATIVE CHOLANGIOGRAM (N/A) as a surgical intervention .  The patient's history has been reviewed, patient examined, no change in status, stable for surgery.  I have reviewed the patient's chart and labs.  Questions were answered to the patient's satisfaction.     Gervis Gaba B

## 2014-11-15 DIAGNOSIS — K7469 Other cirrhosis of liver: Secondary | ICD-10-CM | POA: Diagnosis not present

## 2014-11-15 LAB — HEMOGLOBIN A1C
Hgb A1c MFr Bld: 5.9 % — ABNORMAL HIGH (ref 4.8–5.6)
MEAN PLASMA GLUCOSE: 123 mg/dL

## 2014-11-15 LAB — COMPREHENSIVE METABOLIC PANEL
ALT: 45 U/L (ref 0–53)
AST: 36 U/L (ref 0–37)
Albumin: 4.1 g/dL (ref 3.5–5.2)
Alkaline Phosphatase: 45 U/L (ref 39–117)
Anion gap: 12 (ref 5–15)
BUN: 33 mg/dL — AB (ref 6–23)
CALCIUM: 10 mg/dL (ref 8.4–10.5)
CO2: 27 mmol/L (ref 19–32)
Chloride: 102 mmol/L (ref 96–112)
Creatinine, Ser: 1.54 mg/dL — ABNORMAL HIGH (ref 0.50–1.35)
GFR calc Af Amer: 52 mL/min — ABNORMAL LOW (ref >90)
GFR calc non Af Amer: 45 mL/min — ABNORMAL LOW (ref >90)
Glucose, Bld: 172 mg/dL — ABNORMAL HIGH (ref 70–99)
Potassium: 4.6 mmol/L (ref 3.5–5.1)
Sodium: 141 mmol/L (ref 135–145)
TOTAL PROTEIN: 6.7 g/dL (ref 6.0–8.3)
Total Bilirubin: 0.7 mg/dL (ref 0.3–1.2)

## 2014-11-15 LAB — CBC
HEMATOCRIT: 41.5 % (ref 39.0–52.0)
Hemoglobin: 12.6 g/dL — ABNORMAL LOW (ref 13.0–17.0)
MCH: 27.9 pg (ref 26.0–34.0)
MCHC: 30.4 g/dL (ref 30.0–36.0)
MCV: 92 fL (ref 78.0–100.0)
Platelets: 121 10*3/uL — ABNORMAL LOW (ref 150–400)
RBC: 4.51 MIL/uL (ref 4.22–5.81)
RDW: 13.9 % (ref 11.5–15.5)
WBC: 8 10*3/uL (ref 4.0–10.5)

## 2014-11-15 LAB — GLUCOSE, CAPILLARY
GLUCOSE-CAPILLARY: 165 mg/dL — AB (ref 70–99)
Glucose-Capillary: 171 mg/dL — ABNORMAL HIGH (ref 70–99)

## 2014-11-15 LAB — PROTIME-INR
INR: 1.17 (ref 0.00–1.49)
PROTHROMBIN TIME: 15 s (ref 11.6–15.2)

## 2014-11-15 MED ORDER — OXYCODONE HCL 5 MG PO TABS: 5.0000 mg | ORAL_TABLET | ORAL | Status: DC | PRN

## 2014-11-15 NOTE — Progress Notes (Signed)
Patient ID: Javier Potter, male   DOB: 06-Dec-1945, 69 y.o.   MRN: [id number] 1 Day Post-Op  Subjective: No complaints this morning. Denies pain or nausea.  Objective: Vital signs in last 24 hours: Temp:  [97 F (36.1 C)-99.3 F (37.4 C)] 99.3 F (37.4 C) (03/19 0611) Pulse Rate:  [54-82] 72 (03/19 0611) Resp:  [10-19] 18 (03/19 0611) BP: (111-174)/(45-73) 135/58 mmHg (03/19 0611) SpO2:  [93 %-100 %] 99 % (03/19 0611) Last BM Date: 11/13/14  Intake/Output from previous day: 03/18 0701 - 03/19 0700 In: 2490 [P.O.:240; I.V.:2250] Out: 1076 [Urine:1076] Intake/Output this shift:    General appearance: alert, cooperative and no distress GI: normal findings: soft, non-tender Incision/Wound: clean and dry  Lab Results:   Recent Labs  11/15/14 0628  WBC 8.0  HGB 12.6*  HCT 41.5  PLT 121*   BMET  Recent Labs  11/15/14 0628  NA 141  K 4.6  CL 102  CO2 27  GLUCOSE 172*  BUN 33*  CREATININE 1.54*  CALCIUM 10.0     Studies/Results: Dg Cholangiogram Operative  11/14/2014   CLINICAL DATA:  Cholelithiasis  EXAM: INTRAOPERATIVE CHOLANGIOGRAM  TECHNIQUE: Cholangiographic images from the C-arm fluoroscopic device were submitted for interpretation post-operatively. Please see the procedural report for the amount of contrast and the fluoroscopy time utilized.  COMPARISON:  None.  FINDINGS: Contrast fills the biliary tree and duodenum without filling defects in the common bile duct.  IMPRESSION: Patent biliary tree.  No common bile duct stones.   Electronically Signed   By: Marybelle Killings M.D.   On: 11/14/2014 10:16    Anti-infectives: Anti-infectives    Start     Dose/Rate Route Frequency Ordered Stop   11/14/14 0830  ceFAZolin (ANCEF) IVPB 2 g/50 mL premix     2 g 100 mL/hr over 30 Minutes Intravenous On call to O.R. 11/14/14 [id number] 11/14/14 0907      Assessment/Plan: s/p Procedure(s): LAPAROSCOPIC CHOLECYSTECTOMY WITH INTRAOPERATIVE CHOLANGIOGRAM Doing well without  apparent complication. Okay for discharge.       Wilmore Holsomback T 11/15/2014

## 2014-11-15 NOTE — Progress Notes (Signed)
No episodes respiratory difficulties during the night.  Patient was on O2 at 2L, with sats in 97 - 100.  ETs  around 6.

## 2014-11-15 NOTE — Progress Notes (Signed)
Assessment unchanged. Pt and wife verbalized understanding of dc instructions through teach back. Script x 1 given by MD. Discharged via wc to front entrance to meet awaiting vehicle to carry home. Accompanied by wife and NT.

## 2014-11-15 NOTE — Discharge Instructions (Signed)
[facility] ______[facility] °LAPAROSCOPIC SURGERY: POST OP INSTRUCTIONS °Always review your discharge instruction sheet given to you by the facility where your surgery was performed. °IF YOU HAVE DISABILITY OR FAMILY LEAVE FORMS, YOU MUST BRING THEM TO THE OFFICE FOR PROCESSING.   °DO NOT GIVE THEM TO YOUR DOCTOR. ° °1. A prescription for pain medication may be given to you upon discharge.  Take your pain medication as prescribed, if needed.  If narcotic pain medicine is not needed, then you may take acetaminophen (Tylenol) or ibuprofen (Advil) as needed. °2. Take your usually prescribed medications unless otherwise directed. °3. If you need a refill on your pain medication, please contact your pharmacy.  They will contact our office to request authorization. Prescriptions will not be filled after 5pm or on week-ends. °4. You should follow a light diet the first few days after arrival home, such as soup and crackers, etc.  Be sure to include lots of fluids daily. °5. Most patients will experience some swelling and bruising in the area of the incisions.  Ice packs will help.  Swelling and bruising can take several days to resolve.  °6. It is common to experience some constipation if taking pain medication after surgery.  Increasing fluid intake and taking a stool softener (such as Colace) will usually help or prevent this problem from occurring.  A mild laxative (Milk of Magnesia or Miralax) should be taken according to package instructions if there are no bowel movements after 48 hours. °7. Unless discharge instructions indicate otherwise, you may remove your bandages 24-48 hours after surgery, and you may shower at that time.  You may have steri-strips (small skin tapes) in place directly over the incision.  These strips should be left on the skin for 7-10 days.  If your surgeon used skin glue on the incision, you may shower in 24 hours.  The glue will flake off over the next 2-3 weeks.  Any sutures or  staples will be removed at the office during your follow-up visit. °8. ACTIVITIES:  You may resume regular (light) daily activities beginning the next day--such as daily self-care, walking, climbing stairs--gradually increasing activities as tolerated.  You may have sexual intercourse when it is comfortable.  Refrain from any heavy lifting or straining until approved by your doctor. °a. You may drive when you are no longer taking prescription pain medication, you can comfortably wear a seatbelt, and you can safely maneuver your car and apply brakes. °b. RETURN TO WORK:  __________________________________________________________ °9. You should see your doctor in the office for a follow-up appointment approximately 2-3 weeks after your surgery.  Make sure that you call for this appointment within a day or two after you arrive home to insure a convenient appointment time. °10. OTHER INSTRUCTIONS: __________________________________________________________________________________________________________________________ __________________________________________________________________________________________________________________________ °WHEN TO CALL YOUR DOCTOR: °1. Fever over 101.0 °2. Inability to urinate °3. Continued bleeding from incision. °4. Increased pain, redness, or drainage from the incision. °5. Increasing abdominal pain ° °The clinic staff is available to answer your questions during regular business hours.  Please don’t hesitate to call and ask to speak to one of the nurses for clinical concerns.  If you have a medical emergency, go to the nearest emergency room or call 911.  A surgeon from [facility] is always on call at the hospital. °[address], [address], [place], [place]  [address] ? [address] [address], [place],    [address] °[phone] ? [phone] ? FAX [phone] °Web site:   [email or web address] °

## 2014-11-15 NOTE — Progress Notes (Signed)
UR completed 

## 2014-11-17 ENCOUNTER — Encounter (HOSPITAL_COMMUNITY): Payer: Self-pay | Admitting: Surgery

## 2014-11-25 DIAGNOSIS — R202 Paresthesia of skin: Secondary | ICD-10-CM | POA: Diagnosis not present

## 2014-11-25 DIAGNOSIS — R300 Dysuria: Secondary | ICD-10-CM | POA: Diagnosis not present

## 2014-11-25 DIAGNOSIS — K7460 Unspecified cirrhosis of liver: Secondary | ICD-10-CM | POA: Diagnosis not present

## 2014-11-25 DIAGNOSIS — E291 Testicular hypofunction: Secondary | ICD-10-CM | POA: Diagnosis not present

## 2014-11-25 DIAGNOSIS — E1140 Type 2 diabetes mellitus with diabetic neuropathy, unspecified: Secondary | ICD-10-CM | POA: Diagnosis not present

## 2014-12-09 DIAGNOSIS — E291 Testicular hypofunction: Secondary | ICD-10-CM | POA: Diagnosis not present

## 2014-12-12 NOTE — Discharge Summary (Signed)
Physician Discharge Summary  Patient ID: Javier Potter MRN: [id number] DOB/AGE: [birth year] 69 y.o.  Admit date: 11/14/2014 Discharge date: 11/15/14   Admission Diagnoses:  cholecystolithiasis  Discharge Diagnoses:  Same; steatohepatitis  Active Problems:   History of laparoscopic cholecystectomy   Surgery:  Lap chole, IOC  Discharged Condition: improved  Hospital Course:   Patient has lap chole with IOC.  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 Marcellus Scott.  The patient was kept for overnight observation and did well.    Consults: none  Significant Diagnostic Studies: IOC    Discharge Exam: Blood pressure 151/97, pulse 76, temperature 99.2 F (37.3 C), temperature source Oral, resp. rate 18, height 5\' 7"  (1.702 m), weight 87.998 kg (194 lb), SpO2 99 %. Incisions OK.    Disposition: 01-Home or Self Care  Discharge Instructions    Discharge patient    Complete by:  As directed             Medication List    TAKE these medications        Alpha-Lipoic Acid 600 MG Caps  Take 1 capsule by mouth every morning.     aspirin EC 81 MG tablet  Take 81 mg by mouth at bedtime.     cetirizine 10 MG chewable tablet  Commonly known as:  ZYRTEC  Chew 10 mg by mouth daily.     citalopram 40 MG tablet  Commonly known as:  CELEXA  Take 40 mg by mouth every morning.     citalopram 40 MG tablet  Commonly known as:  CELEXA  Take 40 mg by mouth daily.     diphenhydrAMINE 25 MG tablet  Commonly known as:  SOMINEX  Take 25 mg by mouth at bedtime as needed for allergies.     fenofibrate 160 MG tablet  Take 160 mg by mouth at bedtime.     Fish Oil 1200 MG Caps  Take 2 capsules by mouth 2 (two) times daily.     insulin aspart 100 UNIT/ML injection  Commonly known as:  novoLOG  Inject 32 Units into the skin 2 (two) times daily.     insulin glargine 100 UNIT/ML injection  Commonly known as:  LANTUS  Inject 100 Units into the skin 2  (two) times daily.     Iron 325 (65 FE) MG Tabs  Take 1 tablet by mouth every morning.     lisinopril 20 MG tablet  Commonly known as:  PRINIVIL,ZESTRIL  Take 20 mg by mouth at bedtime.     Magnesium 250 MG Tabs  Take 400 mg by mouth every morning.     metoprolol succinate 50 MG 24 hr tablet  Commonly known as:  TOPROL-XL  Take 50 mg by mouth every morning. Take with or immediately following a meal.     multivitamin with minerals Tabs tablet  Take 1 tablet by mouth every morning.     omeprazole 20 MG capsule  Commonly known as:  PRILOSEC  Take 20 mg by mouth 2 (two) times daily.     oxyCODONE 5 MG immediate release tablet  Commonly known as:  Oxy IR/ROXICODONE  Take 1 tablet (5 mg total) by mouth every 3 (three) hours as needed for moderate pain or severe pain.     pioglitazone 30 MG tablet  Commonly known as:  ACTOS  Take 30 mg by mouth every morning.     potassium chloride SA 20 MEQ tablet  Commonly known as:  K-DUR,KLOR-CON  Take 20 mEq by mouth daily.     psyllium 58.6 % powder  Commonly known as:  METAMUCIL  Take 1 packet by mouth at bedtime.     REMERON 15 MG tablet  Generic drug:  mirtazapine  Take 15 mg by mouth at bedtime.     rosuvastatin 10 MG tablet  Commonly known as:  CRESTOR  Take 10 mg by mouth every morning.     testosterone cypionate 200 MG/ML injection  Commonly known as:  DEPOTESTOTERONE CYPIONATE  Inject 200 mg into the muscle every 28 (twenty-eight) days.     VITAMIN B-12 PO  Take 2,500 mcg by mouth every morning.     cyanocobalamin 1000 MCG/ML injection  Commonly known as:  (VITAMIN B-12)  Inject 1,000 mcg into the muscle every 30 (thirty) days.     Vitamin D 2000 UNITS Caps  Take 1 capsule by mouth every morning.           Follow-up Information    Follow up with Johnathan Hausen B, MD. Schedule an appointment as soon as possible for a visit in 2 weeks.   Specialty:  General Surgery   Contact information:   [address] [place] [address] [phone]       Signed: Pedro Earls 12/12/2014, 3:31 PM

## 2014-12-23 DIAGNOSIS — E538 Deficiency of other specified B group vitamins: Secondary | ICD-10-CM | POA: Diagnosis not present

## 2014-12-23 DIAGNOSIS — E291 Testicular hypofunction: Secondary | ICD-10-CM | POA: Diagnosis not present

## 2014-12-24 DIAGNOSIS — M2022 Hallux rigidus, left foot: Secondary | ICD-10-CM | POA: Diagnosis not present

## 2014-12-24 DIAGNOSIS — M722 Plantar fascial fibromatosis: Secondary | ICD-10-CM | POA: Diagnosis not present

## 2014-12-25 DIAGNOSIS — K7460 Unspecified cirrhosis of liver: Secondary | ICD-10-CM | POA: Diagnosis not present

## 2014-12-25 DIAGNOSIS — G4700 Insomnia, unspecified: Secondary | ICD-10-CM | POA: Diagnosis not present

## 2014-12-26 DIAGNOSIS — H2513 Age-related nuclear cataract, bilateral: Secondary | ICD-10-CM | POA: Diagnosis not present

## 2014-12-26 DIAGNOSIS — H52223 Regular astigmatism, bilateral: Secondary | ICD-10-CM | POA: Diagnosis not present

## 2014-12-26 DIAGNOSIS — H35033 Hypertensive retinopathy, bilateral: Secondary | ICD-10-CM | POA: Diagnosis not present

## 2014-12-31 DIAGNOSIS — E291 Testicular hypofunction: Secondary | ICD-10-CM | POA: Diagnosis not present

## 2014-12-31 DIAGNOSIS — N529 Male erectile dysfunction, unspecified: Secondary | ICD-10-CM | POA: Diagnosis not present

## 2014-12-31 DIAGNOSIS — N183 Chronic kidney disease, stage 3 (moderate): Secondary | ICD-10-CM | POA: Diagnosis not present

## 2014-12-31 DIAGNOSIS — E1122 Type 2 diabetes mellitus with diabetic chronic kidney disease: Secondary | ICD-10-CM | POA: Diagnosis not present

## 2014-12-31 DIAGNOSIS — K7460 Unspecified cirrhosis of liver: Secondary | ICD-10-CM | POA: Diagnosis not present

## 2014-12-31 DIAGNOSIS — K760 Fatty (change of) liver, not elsewhere classified: Secondary | ICD-10-CM | POA: Diagnosis not present

## 2015-01-06 DIAGNOSIS — K7460 Unspecified cirrhosis of liver: Secondary | ICD-10-CM | POA: Diagnosis not present

## 2015-01-06 DIAGNOSIS — E291 Testicular hypofunction: Secondary | ICD-10-CM | POA: Diagnosis not present

## 2015-01-06 DIAGNOSIS — Z23 Encounter for immunization: Secondary | ICD-10-CM | POA: Diagnosis not present

## 2015-01-20 DIAGNOSIS — E291 Testicular hypofunction: Secondary | ICD-10-CM | POA: Diagnosis not present

## 2015-01-20 DIAGNOSIS — G4700 Insomnia, unspecified: Secondary | ICD-10-CM | POA: Diagnosis not present

## 2015-02-03 DIAGNOSIS — E291 Testicular hypofunction: Secondary | ICD-10-CM | POA: Diagnosis not present

## 2015-02-17 DIAGNOSIS — E291 Testicular hypofunction: Secondary | ICD-10-CM | POA: Diagnosis not present

## 2015-03-03 DIAGNOSIS — E291 Testicular hypofunction: Secondary | ICD-10-CM | POA: Diagnosis not present

## 2015-03-17 DIAGNOSIS — E538 Deficiency of other specified B group vitamins: Secondary | ICD-10-CM | POA: Diagnosis not present

## 2015-03-17 DIAGNOSIS — E291 Testicular hypofunction: Secondary | ICD-10-CM | POA: Diagnosis not present

## 2015-03-31 DIAGNOSIS — N529 Male erectile dysfunction, unspecified: Secondary | ICD-10-CM | POA: Diagnosis not present

## 2015-04-21 DIAGNOSIS — D509 Iron deficiency anemia, unspecified: Secondary | ICD-10-CM | POA: Diagnosis not present

## 2015-04-21 DIAGNOSIS — E291 Testicular hypofunction: Secondary | ICD-10-CM | POA: Diagnosis not present

## 2015-05-05 DIAGNOSIS — E291 Testicular hypofunction: Secondary | ICD-10-CM | POA: Diagnosis not present

## 2015-05-26 DIAGNOSIS — D509 Iron deficiency anemia, unspecified: Secondary | ICD-10-CM | POA: Diagnosis not present

## 2015-05-26 DIAGNOSIS — E291 Testicular hypofunction: Secondary | ICD-10-CM | POA: Diagnosis not present

## 2015-06-03 DIAGNOSIS — I10 Essential (primary) hypertension: Secondary | ICD-10-CM | POA: Diagnosis not present

## 2015-06-03 DIAGNOSIS — K760 Fatty (change of) liver, not elsewhere classified: Secondary | ICD-10-CM | POA: Diagnosis not present

## 2015-06-03 DIAGNOSIS — R0989 Other specified symptoms and signs involving the circulatory and respiratory systems: Secondary | ICD-10-CM | POA: Diagnosis not present

## 2015-06-03 DIAGNOSIS — K7460 Unspecified cirrhosis of liver: Secondary | ICD-10-CM | POA: Diagnosis not present

## 2015-06-03 DIAGNOSIS — Z125 Encounter for screening for malignant neoplasm of prostate: Secondary | ICD-10-CM | POA: Diagnosis not present

## 2015-06-03 DIAGNOSIS — Z79899 Other long term (current) drug therapy: Secondary | ICD-10-CM | POA: Diagnosis not present

## 2015-06-03 DIAGNOSIS — Z794 Long term (current) use of insulin: Secondary | ICD-10-CM | POA: Diagnosis not present

## 2015-06-03 DIAGNOSIS — E1121 Type 2 diabetes mellitus with diabetic nephropathy: Secondary | ICD-10-CM | POA: Diagnosis not present

## 2015-06-03 DIAGNOSIS — E291 Testicular hypofunction: Secondary | ICD-10-CM | POA: Diagnosis not present

## 2015-06-03 DIAGNOSIS — Z0001 Encounter for general adult medical examination with abnormal findings: Secondary | ICD-10-CM | POA: Diagnosis not present

## 2015-06-03 DIAGNOSIS — E559 Vitamin D deficiency, unspecified: Secondary | ICD-10-CM | POA: Diagnosis not present

## 2015-06-03 DIAGNOSIS — E1165 Type 2 diabetes mellitus with hyperglycemia: Secondary | ICD-10-CM | POA: Diagnosis not present

## 2015-06-03 DIAGNOSIS — E1122 Type 2 diabetes mellitus with diabetic chronic kidney disease: Secondary | ICD-10-CM | POA: Diagnosis not present

## 2015-06-03 DIAGNOSIS — Z23 Encounter for immunization: Secondary | ICD-10-CM | POA: Diagnosis not present

## 2015-06-05 ENCOUNTER — Other Ambulatory Visit: Payer: Self-pay | Admitting: Internal Medicine

## 2015-06-05 DIAGNOSIS — R0989 Other specified symptoms and signs involving the circulatory and respiratory systems: Secondary | ICD-10-CM

## 2015-06-09 ENCOUNTER — Other Ambulatory Visit: Payer: Medicare Other

## 2015-06-09 DIAGNOSIS — E291 Testicular hypofunction: Secondary | ICD-10-CM | POA: Diagnosis not present

## 2015-06-11 ENCOUNTER — Ambulatory Visit
Admission: RE | Admit: 2015-06-11 | Discharge: 2015-06-11 | Disposition: A | Discharge status: Home / Self Care | Payer: Medicare Other | Source: Ambulatory Visit | Attending: Internal Medicine | Admitting: Internal Medicine

## 2015-06-11 DIAGNOSIS — R0989 Other specified symptoms and signs involving the circulatory and respiratory systems: Secondary | ICD-10-CM

## 2015-06-11 DIAGNOSIS — I6523 Occlusion and stenosis of bilateral carotid arteries: Secondary | ICD-10-CM | POA: Diagnosis not present

## 2015-06-22 ENCOUNTER — Other Ambulatory Visit: Payer: Self-pay | Admitting: *Deleted

## 2015-06-22 DIAGNOSIS — I6521 Occlusion and stenosis of right carotid artery: Secondary | ICD-10-CM

## 2015-06-23 DIAGNOSIS — E291 Testicular hypofunction: Secondary | ICD-10-CM | POA: Diagnosis not present

## 2015-07-07 DIAGNOSIS — E1122 Type 2 diabetes mellitus with diabetic chronic kidney disease: Secondary | ICD-10-CM | POA: Diagnosis not present

## 2015-07-07 DIAGNOSIS — N529 Male erectile dysfunction, unspecified: Secondary | ICD-10-CM | POA: Diagnosis not present

## 2015-07-07 DIAGNOSIS — N183 Chronic kidney disease, stage 3 (moderate): Secondary | ICD-10-CM | POA: Diagnosis not present

## 2015-07-07 DIAGNOSIS — E291 Testicular hypofunction: Secondary | ICD-10-CM | POA: Diagnosis not present

## 2015-07-07 DIAGNOSIS — D649 Anemia, unspecified: Secondary | ICD-10-CM | POA: Diagnosis not present

## 2015-07-07 DIAGNOSIS — Z794 Long term (current) use of insulin: Secondary | ICD-10-CM | POA: Diagnosis not present

## 2015-07-21 ENCOUNTER — Encounter: Payer: Self-pay | Admitting: Vascular Surgery

## 2015-07-21 DIAGNOSIS — E291 Testicular hypofunction: Secondary | ICD-10-CM | POA: Diagnosis not present

## 2015-07-28 ENCOUNTER — Ambulatory Visit (HOSPITAL_COMMUNITY)
Admission: RE | Admit: 2015-07-28 | Discharge: 2015-07-28 | Disposition: A | Discharge status: Home / Self Care | Payer: Medicare Other | Source: Ambulatory Visit | Attending: Vascular Surgery | Admitting: Vascular Surgery

## 2015-07-28 ENCOUNTER — Encounter: Payer: Self-pay | Admitting: Vascular Surgery

## 2015-07-28 ENCOUNTER — Ambulatory Visit (INDEPENDENT_AMBULATORY_CARE_PROVIDER_SITE_OTHER): Payer: Medicare Other | Admitting: Vascular Surgery

## 2015-07-28 VITALS — BP 122/70 | HR 66 | Temp 98.2°F | Resp 18 | Ht 65.0 in | Wt 181.4 lb

## 2015-07-28 DIAGNOSIS — I6521 Occlusion and stenosis of right carotid artery: Secondary | ICD-10-CM

## 2015-07-28 DIAGNOSIS — I6523 Occlusion and stenosis of bilateral carotid arteries: Secondary | ICD-10-CM

## 2015-07-28 NOTE — Addendum Note (Signed)
Addended by: Dorthula Rue L on: 07/28/2015 05:10 PM   Modules accepted: Orders

## 2015-07-28 NOTE — Progress Notes (Signed)
[facility] [place]  Patient name: Javier Potter MRN: [id number] DOB: 08-Jun-1946 Sex: male  REASON FOR CONSULT:  Asymptomatic carotid stenosis  HPI: Javier Potter is a 69 y.o. male, who is  Seen today for discussion of recent ultrasound showing bilateral carotid stenosis. He was found to have soft bilateral carotid bruits and also reports bilateral pulsatile tinnitus. He specifically denies any prior history of amaurosis fugax, transient ischemic attack,  aphasia or stroke. He has no significant history of cardiac disease. Does have a history of heart murmur.  Past Medical History  Diagnosis Date  . Hypertension   . Murmur     had since childhood-no problems  . Sleep apnea     uses No CPAP  . Diabetes mellitus     type 2  . Chronic kidney disease   . GERD (gastroesophageal reflux disease)     under control  . Anemia     History reviewed. No pertinent family history.  SOCIAL HISTORY: Social History   Social History  . Marital Status: Married    Spouse Name: N/A  . Number of Children: N/A  . Years of Education: N/A   Occupational History  . Not on file.   Social History Main Topics  . Smoking status: Former Smoker -- 3.00 packs/day for 40 years    Types: Cigarettes    Quit date: 06/18/1999  . Smokeless tobacco: Never Used  . Alcohol Use: Yes     Comment: rarely  . Drug Use: No  . Sexual Activity: Not on file   Other Topics Concern  . Not on file   Social History Narrative    Allergies  Allergen Reactions  . Ambien [Zolpidem Tartrate]     "did not sit well with system..made me go crazy"  . Augmentin [Amoxicillin-Pot Clavulanate] Swelling    Tongue swells Patient states has tolerated amoxicillin  . Sulfa Antibiotics Swelling    Tongue swells    Current Outpatient Prescriptions  Medication Sig Dispense Refill  . Alpha-Lipoic Acid 600 MG CAPS Take 1 capsule by mouth every morning.     Marland Kitchen aspirin EC 81 MG tablet Take 81 mg by  mouth at bedtime.    . canagliflozin (INVOKANA) 100 MG TABS tablet Take 100 mg by mouth at bedtime.    . cetirizine (ZYRTEC) 10 MG chewable tablet Chew 10 mg by mouth daily.    . Cholecalciferol (VITAMIN D) 2000 UNITS CAPS Take 1 capsule by mouth every morning.    . citalopram (CELEXA) 40 MG tablet Take 20 mg by mouth daily.     . cyanocobalamin (,VITAMIN B-12,) 1000 MCG/ML injection Inject 1,000 mcg into the muscle every 30 (thirty) days.    . diphenhydrAMINE (SOMINEX) 25 MG tablet Take 25 mg by mouth at bedtime as needed for allergies.     . fenofibrate 160 MG tablet Take 160 mg by mouth at bedtime.     . Ferrous Sulfate (IRON) 325 (65 FE) MG TABS Take 1 tablet by mouth every morning.    . insulin aspart (NOVOLOG) 100 UNIT/ML injection Inject 28 Units into the skin 2 (two) times daily.     . insulin glargine (LANTUS) 100 UNIT/ML injection Inject 30 Units into the skin 2 (two) times daily.     Marland Kitchen lisinopril (PRINIVIL,ZESTRIL) 20 MG tablet Take 20 mg by mouth at bedtime.    . Magnesium 250 MG TABS Take 400 mg by mouth every morning.     . metoprolol  succinate (TOPROL-XL) 50 MG 24 hr tablet Take 50 mg by mouth every morning. Take with or immediately following a meal.    . Multiple Vitamin (MULTIVITAMIN WITH MINERALS) TABS tablet Take 1 tablet by mouth every morning.    . Omega-3 Fatty Acids (FISH OIL) 1200 MG CAPS Take 2 capsules by mouth 2 (two) times daily.     Marland Kitchen omeprazole (PRILOSEC) 20 MG capsule Take 20 mg by mouth 2 (two) times daily.    . pioglitazone (ACTOS) 30 MG tablet Take 30 mg by mouth every morning.    . potassium chloride SA (K-DUR,KLOR-CON) 20 MEQ tablet Take 20 mEq by mouth daily.     . psyllium (METAMUCIL) 58.6 % powder Take 1 packet by mouth at bedtime.    . rosuvastatin (CRESTOR) 10 MG tablet Take 10 mg by mouth every morning.    . testosterone cypionate (DEPOTESTOTERONE CYPIONATE) 200 MG/ML injection Inject 200 mg into the muscle every 28 (twenty-eight) days.    . traZODone  (DESYREL) 50 MG tablet Take 50 mg by mouth at bedtime.    . mirtazapine (REMERON) 15 MG tablet Take 15 mg by mouth at bedtime.    Marland Kitchen oxyCODONE (OXY IR/ROXICODONE) 5 MG immediate release tablet Take 1 tablet (5 mg total) by mouth every 3 (three) hours as needed for moderate pain or severe pain. (Patient not taking: Reported on 07/28/2015) 30 tablet 0   No current facility-administered medications for this visit.    REVIEW OF SYSTEMS:  [X]  denotes positive finding, [ ]  denotes negative finding Cardiac  Comments:  Chest pain or chest pressure:    Shortness of breath upon exertion:    Short of breath when lying flat:    Irregular heart rhythm: x       Vascular    Pain in calf, thigh, or hip brought on by ambulation:    Pain in feet at night that wakes you up from your sleep:     Blood clot in your veins:    Leg swelling:         Pulmonary    Oxygen at home:    Productive cough:     Wheezing:         Neurologic    Sudden weakness in arms or legs:     Sudden numbness in arms or legs:     Sudden onset of difficulty speaking or slurred speech:    Temporary loss of vision in one eye:     Problems with dizziness:         Gastrointestinal    Blood in stool:     Vomited blood:         Genitourinary    Burning when urinating:     Blood in urine:        Psychiatric    Major depression:         Hematologic    Bleeding problems:    Problems with blood clotting too easily:        Skin    Rashes or ulcers:        Constitutional    Fever or chills:      PHYSICAL EXAM: Filed Vitals:   07/28/15 1240  BP: 122/70  Pulse: 66  Temp: 98.2 F (36.8 C)  TempSrc: Oral  Resp: 18  Height: 5\' 5"  (1.651 m)  Weight: 181 lb 6.4 oz (82.283 kg)  SpO2: 97%    GENERAL: The patient is a well-nourished male, in no acute distress. The vital  signs are documented above. CARDIAC: There is a regular rate and rhythm.  VASCULAR:  2+ radial 2+ femoral 2+ popliteal and 2+ posterior tibial pulses  bilaterally  Carotid arteries do reveal soft carotid bruits bilaterally. PULMONARY: There is good air exchange bilaterally without wheezing or rales. ABDOMEN: Soft and non-tender with normal pitched bowel sounds.  MUSCULOSKELETAL: There are no major deformities or cyanosis. NEUROLOGIC: No focal weakness or paresthesias are detected. SKIN: There are no ulcers or rashes noted. PSYCHIATRIC: The patient has a normal affect.  DATA:   reviewed the duplex from 06/11/2015 from [facility] imaging. This does show bilateral moderate to severe stenosis. He underwent repeat confirmatory duplex was right side is more significant in our office. This shows similar velocities. In our scale this was in the 40-59% stenosis.  MEDICAL ISSUES:  asymptomatic bilateral moderate to severe carotid stenosis. Had a long discussion with the patient and his wife present. I did review symptoms of carotid disease and he knows to report immediately to the emergency room should he have any neurologic deficits. Explain that this would be quite unlikely at his current level of stenosis. We did discuss risks factors and he is on no daily aspirin antiplatelets treatment currently. I have recommended that we see him again in one year for repeat carotid duplex to rule out progression and is asymptomatic disease. He will seek medical attention should he develop any symptoms.   Curt Jews [facility] [place]: [phone]

## 2015-08-07 DIAGNOSIS — D649 Anemia, unspecified: Secondary | ICD-10-CM | POA: Diagnosis not present

## 2015-08-07 DIAGNOSIS — E291 Testicular hypofunction: Secondary | ICD-10-CM | POA: Diagnosis not present

## 2015-08-21 DIAGNOSIS — E291 Testicular hypofunction: Secondary | ICD-10-CM | POA: Diagnosis not present

## 2015-09-04 DIAGNOSIS — D649 Anemia, unspecified: Secondary | ICD-10-CM | POA: Diagnosis not present

## 2015-09-04 DIAGNOSIS — E291 Testicular hypofunction: Secondary | ICD-10-CM | POA: Diagnosis not present

## 2015-09-17 DIAGNOSIS — H2513 Age-related nuclear cataract, bilateral: Secondary | ICD-10-CM | POA: Diagnosis not present

## 2015-09-18 DIAGNOSIS — E291 Testicular hypofunction: Secondary | ICD-10-CM | POA: Diagnosis not present

## 2015-10-05 DIAGNOSIS — E291 Testicular hypofunction: Secondary | ICD-10-CM | POA: Diagnosis not present

## 2015-10-05 DIAGNOSIS — E538 Deficiency of other specified B group vitamins: Secondary | ICD-10-CM | POA: Diagnosis not present

## 2015-10-13 DIAGNOSIS — N183 Chronic kidney disease, stage 3 (moderate): Secondary | ICD-10-CM | POA: Diagnosis not present

## 2015-10-13 DIAGNOSIS — N529 Male erectile dysfunction, unspecified: Secondary | ICD-10-CM | POA: Diagnosis not present

## 2015-10-13 DIAGNOSIS — E291 Testicular hypofunction: Secondary | ICD-10-CM | POA: Diagnosis not present

## 2015-10-13 DIAGNOSIS — Z794 Long term (current) use of insulin: Secondary | ICD-10-CM | POA: Diagnosis not present

## 2015-10-13 DIAGNOSIS — E1122 Type 2 diabetes mellitus with diabetic chronic kidney disease: Secondary | ICD-10-CM | POA: Diagnosis not present

## 2015-10-13 DIAGNOSIS — D649 Anemia, unspecified: Secondary | ICD-10-CM | POA: Diagnosis not present

## 2015-10-19 DIAGNOSIS — E291 Testicular hypofunction: Secondary | ICD-10-CM | POA: Diagnosis not present

## 2015-11-02 DIAGNOSIS — D649 Anemia, unspecified: Secondary | ICD-10-CM | POA: Diagnosis not present

## 2015-11-02 DIAGNOSIS — E291 Testicular hypofunction: Secondary | ICD-10-CM | POA: Diagnosis not present

## 2015-11-16 DIAGNOSIS — E291 Testicular hypofunction: Secondary | ICD-10-CM | POA: Diagnosis not present

## 2015-11-17 DIAGNOSIS — H2512 Age-related nuclear cataract, left eye: Secondary | ICD-10-CM | POA: Diagnosis not present

## 2015-11-17 DIAGNOSIS — H02839 Dermatochalasis of unspecified eye, unspecified eyelid: Secondary | ICD-10-CM | POA: Diagnosis not present

## 2015-11-17 DIAGNOSIS — H18411 Arcus senilis, right eye: Secondary | ICD-10-CM | POA: Diagnosis not present

## 2015-11-17 DIAGNOSIS — H2511 Age-related nuclear cataract, right eye: Secondary | ICD-10-CM | POA: Diagnosis not present

## 2015-12-03 DIAGNOSIS — D649 Anemia, unspecified: Secondary | ICD-10-CM | POA: Diagnosis not present

## 2015-12-03 DIAGNOSIS — E291 Testicular hypofunction: Secondary | ICD-10-CM | POA: Diagnosis not present

## 2015-12-04 DIAGNOSIS — H2511 Age-related nuclear cataract, right eye: Secondary | ICD-10-CM | POA: Diagnosis not present

## 2015-12-04 DIAGNOSIS — H25812 Combined forms of age-related cataract, left eye: Secondary | ICD-10-CM | POA: Diagnosis not present

## 2015-12-04 DIAGNOSIS — H2512 Age-related nuclear cataract, left eye: Secondary | ICD-10-CM | POA: Diagnosis not present

## 2015-12-17 DIAGNOSIS — E291 Testicular hypofunction: Secondary | ICD-10-CM | POA: Diagnosis not present

## 2015-12-31 DIAGNOSIS — D509 Iron deficiency anemia, unspecified: Secondary | ICD-10-CM | POA: Diagnosis not present

## 2015-12-31 DIAGNOSIS — E291 Testicular hypofunction: Secondary | ICD-10-CM | POA: Diagnosis not present

## 2016-01-01 DIAGNOSIS — H2511 Age-related nuclear cataract, right eye: Secondary | ICD-10-CM | POA: Diagnosis not present

## 2016-01-01 DIAGNOSIS — H25811 Combined forms of age-related cataract, right eye: Secondary | ICD-10-CM | POA: Diagnosis not present

## 2016-01-15 DIAGNOSIS — E291 Testicular hypofunction: Secondary | ICD-10-CM | POA: Diagnosis not present

## 2016-02-01 DIAGNOSIS — E291 Testicular hypofunction: Secondary | ICD-10-CM | POA: Diagnosis not present

## 2016-02-15 DIAGNOSIS — D649 Anemia, unspecified: Secondary | ICD-10-CM | POA: Diagnosis not present

## 2016-02-15 DIAGNOSIS — N529 Male erectile dysfunction, unspecified: Secondary | ICD-10-CM | POA: Diagnosis not present

## 2016-02-29 DIAGNOSIS — E291 Testicular hypofunction: Secondary | ICD-10-CM | POA: Diagnosis not present

## 2016-03-14 DIAGNOSIS — E291 Testicular hypofunction: Secondary | ICD-10-CM | POA: Diagnosis not present

## 2016-03-14 DIAGNOSIS — D649 Anemia, unspecified: Secondary | ICD-10-CM | POA: Diagnosis not present

## 2016-03-20 IMAGING — US US ABDOMEN COMPLETE
1 series · 14 of 25 positions shown · non-contrast
Comparison: CT, 01/07/2011.

CLINICAL DATA: Hepatomegaly.

EXAM:
ULTRASOUND ABDOMEN COMPLETE

[Series 1: us abdomen complete · 0.26mm/px · 14 of 89 slices shown]
[im 1/89]
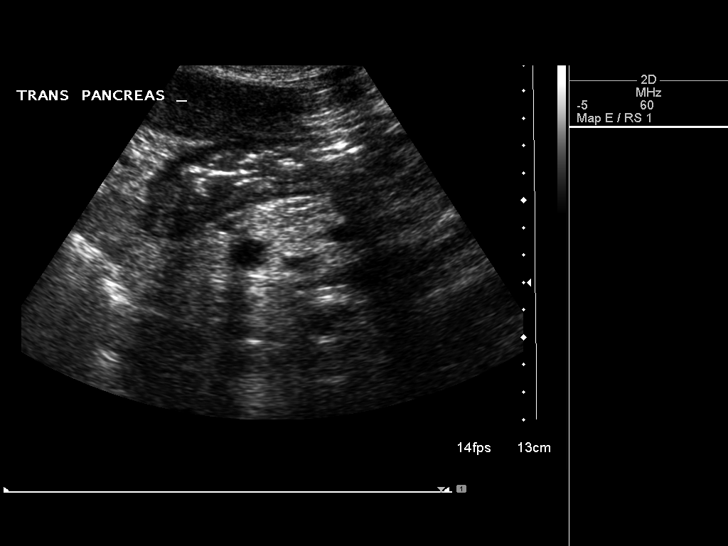
[im 8/89]
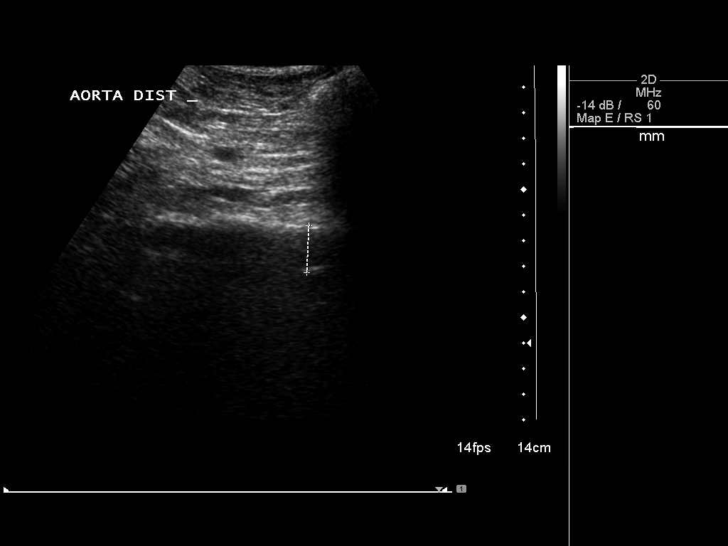
[im 15/89]
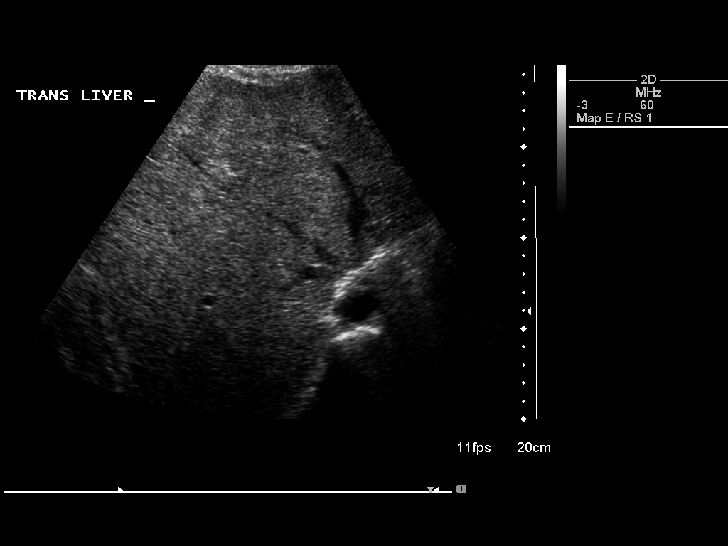
[im 23/89]
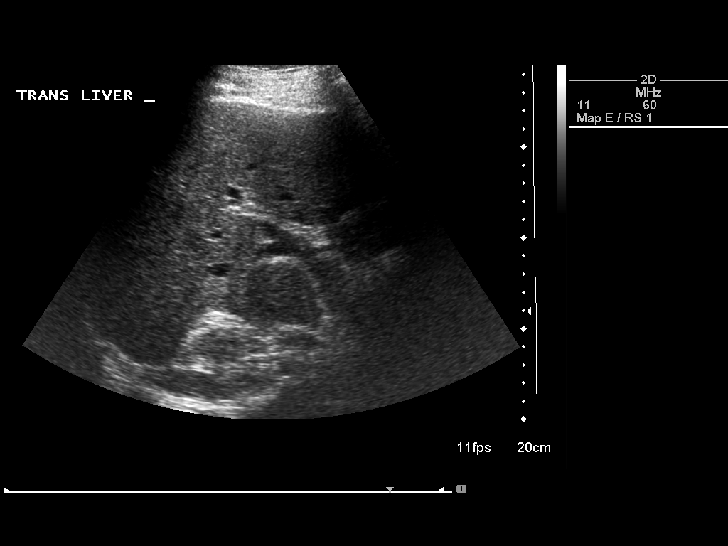
[im 30/89]
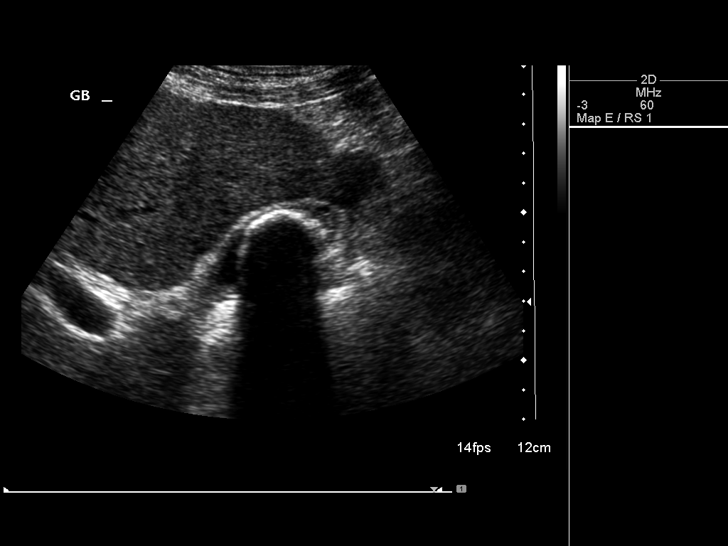
[im 34/89]
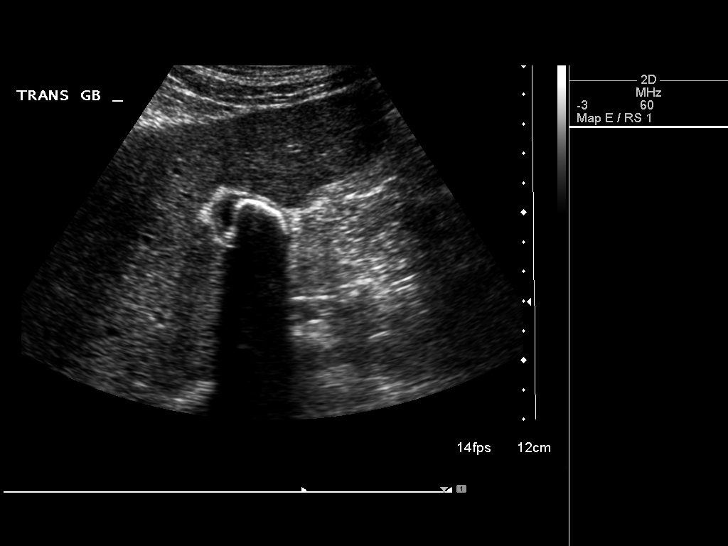
[im 41/89]
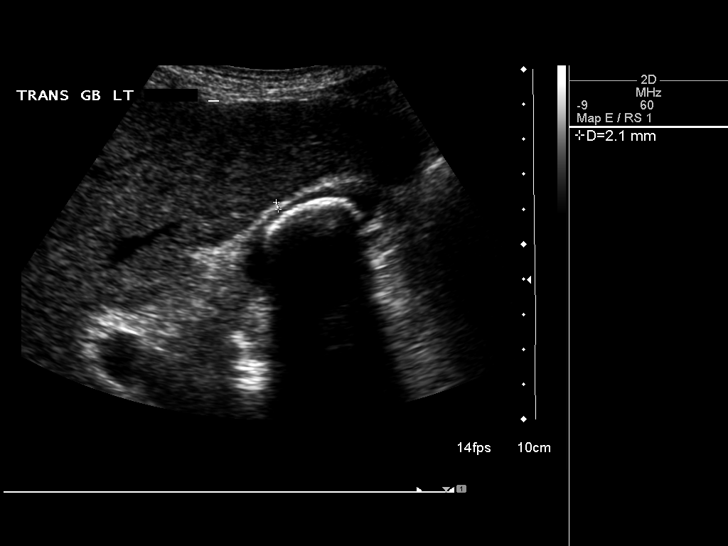
[im 48/89]
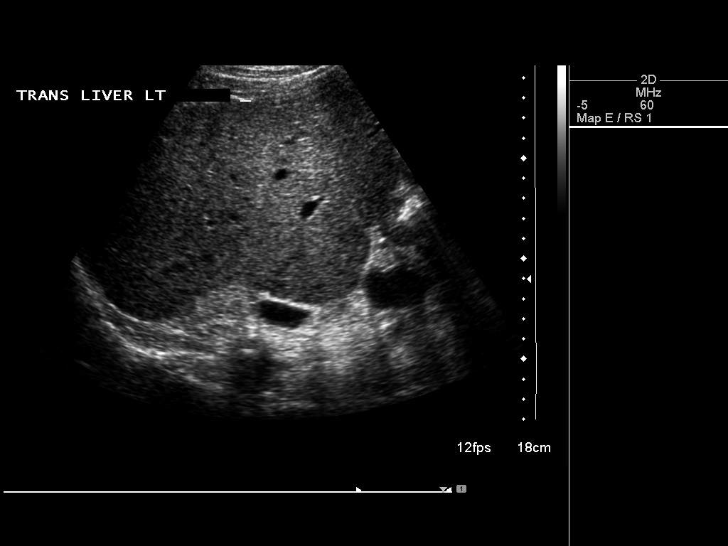
[im 56/89]
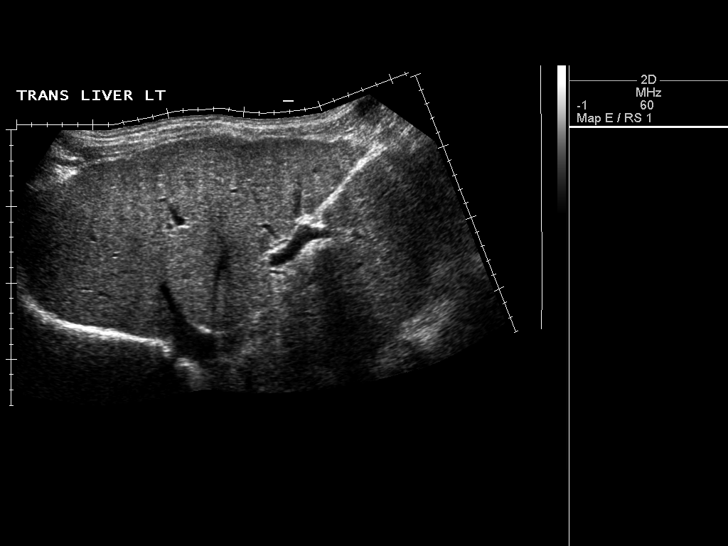
[im 59/89]
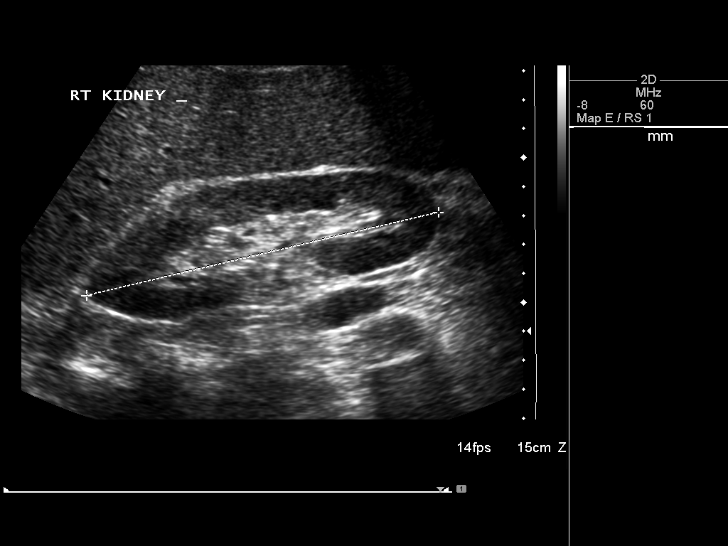
[im 67/89]
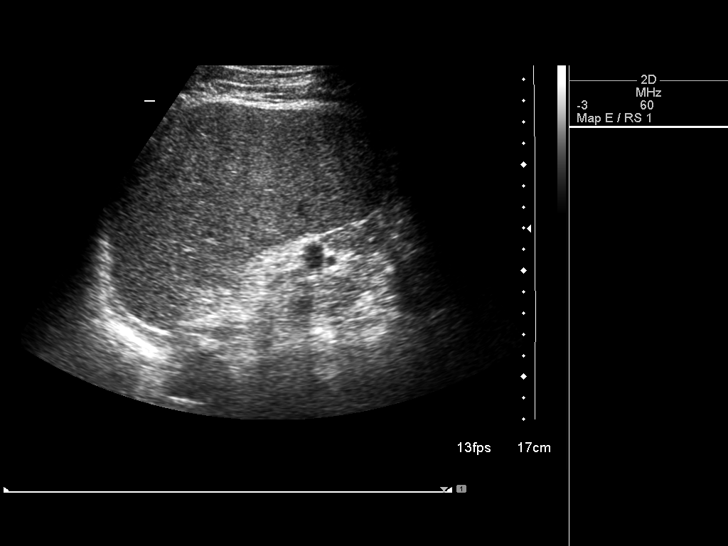
[im 74/89]
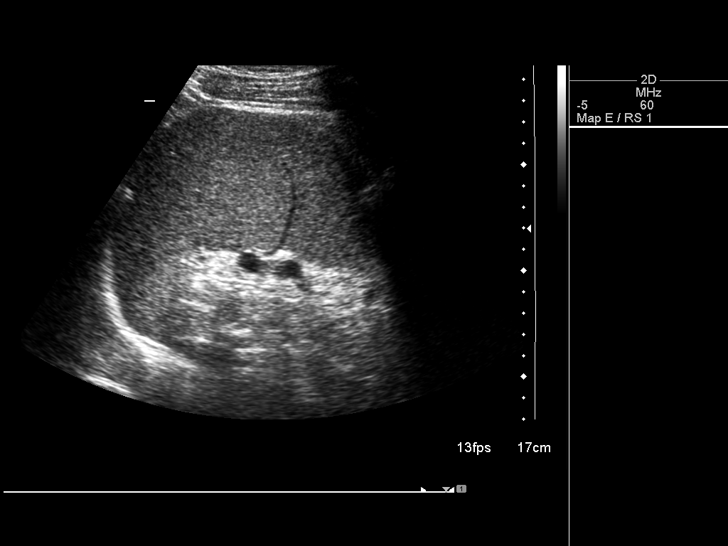
[im 81/89]
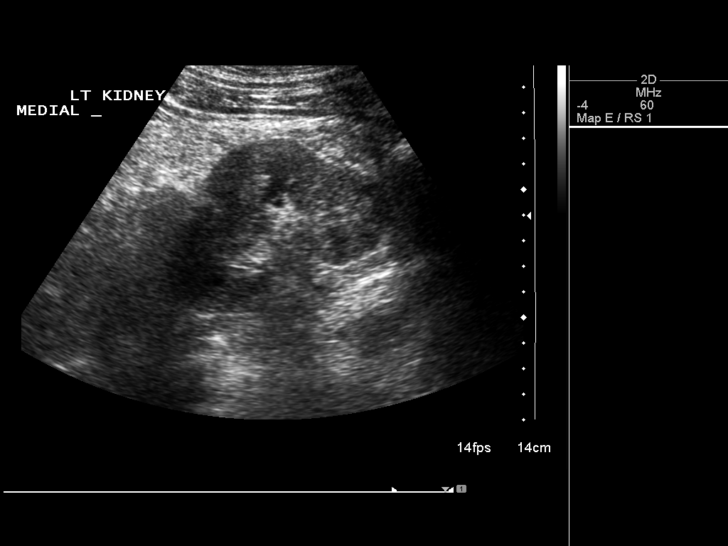
[im 89/89]
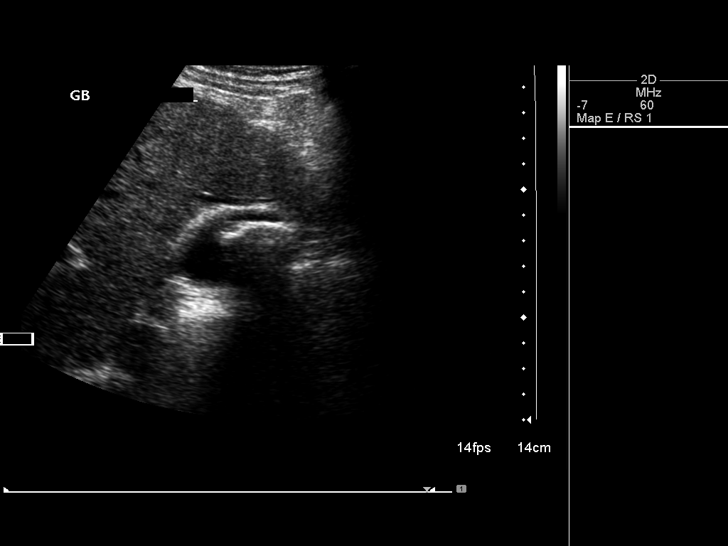

[14 of 25 positions shown; findings below may reference images not displayed]

FINDINGS: Gallbladder: Shadowing gallstones, the largest measuring 3.1 cm. No
gallbladder wall thickening or pericholecystic fluid.

Common bile duct: Diameter: 3.6 mm

Liver: Mildly enlarged with a coarsened echotexture and mild
increased parenchymal echogenicity. No mass or focal lesion.
Hepatopetal flow documented in the portal vein.

IVC: No abnormality visualized.

Pancreas: Visualized portion unremarkable.

Spleen: Enlarged with a volume of 759 mL. No splenic mass or focal
lesion.

Right Kidney: Length: 12.5 cm. Echogenicity within normal limits. No
mass or hydronephrosis visualized.

Left Kidney: Length: 12.4 cm. Echogenicity within normal limits. No
mass or hydronephrosis visualized.

Abdominal aorta: No aneurysm visualized.

Other findings: None.
IMPRESSION: 1. Mild hepatomegaly with findings consistent with cirrhosis and
hepatic steatosis, as well as probable portal venous hypertension
reflected by splenomegaly.
2. No liver mass.
3. Cholelithiasis. No evidence of acute cholecystitis. No bile duct
dilation.
4. No other abnormalities.

## 2016-03-28 DIAGNOSIS — E291 Testicular hypofunction: Secondary | ICD-10-CM | POA: Diagnosis not present

## 2016-04-08 DIAGNOSIS — H35033 Hypertensive retinopathy, bilateral: Secondary | ICD-10-CM | POA: Diagnosis not present

## 2016-04-12 DIAGNOSIS — E1122 Type 2 diabetes mellitus with diabetic chronic kidney disease: Secondary | ICD-10-CM | POA: Diagnosis not present

## 2016-04-12 DIAGNOSIS — Z794 Long term (current) use of insulin: Secondary | ICD-10-CM | POA: Diagnosis not present

## 2016-04-12 DIAGNOSIS — E291 Testicular hypofunction: Secondary | ICD-10-CM | POA: Diagnosis not present

## 2016-04-12 DIAGNOSIS — E538 Deficiency of other specified B group vitamins: Secondary | ICD-10-CM | POA: Diagnosis not present

## 2016-04-12 DIAGNOSIS — N183 Chronic kidney disease, stage 3 (moderate): Secondary | ICD-10-CM | POA: Diagnosis not present

## 2016-04-19 DIAGNOSIS — E291 Testicular hypofunction: Secondary | ICD-10-CM | POA: Diagnosis not present

## 2016-04-21 ENCOUNTER — Telehealth: Payer: Self-pay

## 2016-04-21 DIAGNOSIS — H34211 Partial retinal artery occlusion, right eye: Secondary | ICD-10-CM | POA: Diagnosis not present

## 2016-04-21 DIAGNOSIS — H43811 Vitreous degeneration, right eye: Secondary | ICD-10-CM | POA: Diagnosis not present

## 2016-04-21 NOTE — Telephone Encounter (Signed)
Returned call to pt.  Reported he started seeing "floaters" in right eye in July, and has been followed by his eye doctor.  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  Stated he was strongly encouraged to f/u with the Vascular surgeon, to check on his Carotid Disease.  Pt. denied any temp. loss of vision in eyes, unilateral weakness of extremities, difficulty with speech, facial droop, balance issues, or disorientation.  Advised pt. will have a scheduler call him with an appt.  Agreed.

## 2016-04-21 NOTE — Telephone Encounter (Signed)
-----   Message from Lujean Amel sent at 04/21/2016  3:17 PM EDT ----- Regarding: Appt Question Contact: [phone]  Can a nurse please call this patient back? He called requesting a sooner appt w/ [facility] (he is scheduled for a 1 yr fu on 08/02/16). He saw his eye doctor today and she indicated that he has cholesterol in a vein in his eye and she told the patient he needed to be seen. Thanks, Anne Ng

## 2016-04-22 NOTE — Telephone Encounter (Signed)
Sched lab 8/30 at 4:00 and NP 9/5 at 10:00. Spoke to pt to inform them of appts.

## 2016-04-25 ENCOUNTER — Other Ambulatory Visit: Payer: Self-pay | Admitting: *Deleted

## 2016-04-25 DIAGNOSIS — N529 Male erectile dysfunction, unspecified: Secondary | ICD-10-CM | POA: Diagnosis not present

## 2016-04-25 DIAGNOSIS — Q159 Congenital malformation of eye, unspecified: Secondary | ICD-10-CM

## 2016-04-25 DIAGNOSIS — I6523 Occlusion and stenosis of bilateral carotid arteries: Secondary | ICD-10-CM

## 2016-04-26 ENCOUNTER — Encounter: Payer: Self-pay | Admitting: Vascular Surgery

## 2016-04-27 ENCOUNTER — Ambulatory Visit (HOSPITAL_COMMUNITY)
Admission: RE | Admit: 2016-04-27 | Discharge: 2016-04-27 | Disposition: A | Discharge status: Home / Self Care | Payer: Medicare Other | Source: Ambulatory Visit | Attending: Vascular Surgery | Admitting: Vascular Surgery

## 2016-04-27 DIAGNOSIS — I6523 Occlusion and stenosis of bilateral carotid arteries: Secondary | ICD-10-CM | POA: Diagnosis not present

## 2016-04-27 DIAGNOSIS — Q159 Congenital malformation of eye, unspecified: Secondary | ICD-10-CM

## 2016-04-27 LAB — VAS US CAROTID
LCCAPSYS: 111 cm/s
LEFT ECA DIAS: -34 cm/s
LICAPSYS: -164 cm/s
Left CCA dist dias: 16 cm/s
Left CCA dist sys: 77 cm/s
Left CCA prox dias: 13 cm/s
Left ICA dist dias: -11 cm/s
Left ICA dist sys: -104 cm/s
Left ICA prox dias: -18 cm/s
RCCAPDIAS: 12 cm/s
RCCAPSYS: 120 cm/s
RIGHT CCA MID DIAS: 15 cm/s
RIGHT ECA DIAS: 22 cm/s
Right cca dist sys: -112 cm/s

## 2016-05-03 ENCOUNTER — Encounter: Payer: Self-pay | Admitting: Family

## 2016-05-03 ENCOUNTER — Ambulatory Visit (INDEPENDENT_AMBULATORY_CARE_PROVIDER_SITE_OTHER): Payer: Medicare Other | Admitting: Family

## 2016-05-03 VITALS — BP 154/74 | HR 63 | Ht 65.0 in | Wt 186.8 lb

## 2016-05-03 DIAGNOSIS — H34211 Partial retinal artery occlusion, right eye: Secondary | ICD-10-CM | POA: Diagnosis not present

## 2016-05-03 DIAGNOSIS — I6523 Occlusion and stenosis of bilateral carotid arteries: Secondary | ICD-10-CM

## 2016-05-03 NOTE — Patient Instructions (Signed)
Stroke Prevention Some medical conditions and behaviors are associated with an increased chance of having a stroke. You may prevent a stroke by making healthy choices and managing medical conditions. HOW CAN I REDUCE MY RISK OF HAVING A STROKE?   Stay physically active. Get at least 30 minutes of activity on most or all days.  Do not smoke. It may also be helpful to avoid exposure to secondhand smoke.  Limit alcohol use. Moderate alcohol use is considered to be:  No more than 2 drinks per day for men.  No more than 1 drink per day for nonpregnant women.  Eat healthy foods. This involves:  Eating 5 or more servings of fruits and vegetables a day.  Making dietary changes that address high blood pressure (hypertension), high cholesterol, diabetes, or obesity.  Manage your cholesterol levels.  Making food choices that are high in fiber and low in saturated fat, trans fat, and cholesterol may control cholesterol levels.  Take any prescribed medicines to control cholesterol as directed by your health care provider.  Manage your diabetes.  Controlling your carbohydrate and sugar intake is recommended to manage diabetes.  Take any prescribed medicines to control diabetes as directed by your health care provider.  Control your hypertension.  Making food choices that are low in salt (sodium), saturated fat, trans fat, and cholesterol is recommended to manage hypertension.  Ask your health care provider if you need treatment to lower your blood pressure. Take any prescribed medicines to control hypertension as directed by your health care provider.  If you are 18-39 years of age, have your blood pressure checked every 3-5 years. If you are 40 years of age or older, have your blood pressure checked every year.  Maintain a healthy weight.  Reducing calorie intake and making food choices that are low in sodium, saturated fat, trans fat, and cholesterol are recommended to manage  weight.  Stop drug abuse.  Avoid taking birth control pills.  Talk to your health care provider about the risks of taking birth control pills if you are over 35 years old, smoke, get migraines, or have ever had a blood clot.  Get evaluated for sleep disorders (sleep apnea).  Talk to your health care provider about getting a sleep evaluation if you snore a lot or have excessive sleepiness.  Take medicines only as directed by your health care provider.  For some people, aspirin or blood thinners (anticoagulants) are helpful in reducing the risk of forming abnormal blood clots that can lead to stroke. If you have the irregular heart rhythm of atrial fibrillation, you should be on a blood thinner unless there is a good reason you cannot take them.  Understand all your medicine instructions.  Make sure that other conditions (such as anemia or atherosclerosis) are addressed. SEEK IMMEDIATE MEDICAL CARE IF:   You have sudden weakness or numbness of the face, arm, or leg, especially on one side of the body.  Your face or eyelid droops to one side.  You have sudden confusion.  You have trouble speaking (aphasia) or understanding.  You have sudden trouble seeing in one or both eyes.  You have sudden trouble walking.  You have dizziness.  You have a loss of balance or coordination.  You have a sudden, severe headache with no known cause.  You have new chest pain or an irregular heartbeat. Any of these symptoms may represent a serious problem that is an emergency. Do not wait to see if the symptoms will   go away. Get medical help at once. Call your local emergency services (911 in [place] Do not drive yourself to the hospital.   This information is not intended to replace advice given to you by your health care provider. Make sure you discuss any questions you have with your health care provider.   Document Released: 09/22/2004 Document Revised: 09/05/2014 Document Reviewed:  02/15/2013 Elsevier Interactive Patient Education 2016 Elsevier Inc.  

## 2016-05-03 NOTE — Progress Notes (Addendum)
Chief Complaint: Follow up Extracranial Carotid Artery Stenosis   History of Present Illness  JAJUAN EWALT is a 70 y.o. male patient of Dr. Donnetta Hutching whom he saw in November 2016 on referral. Patient was found to have soft bilateral carotid bruits and also reports bilateral pulsatile tinnitus. He specifically denied any prior history of amaurosis fugax, transient ischemic attack,  aphasia or stroke. He has no significant history of cardiac disease. Does have a history of heart murmur.  He returns today after [facility] triage nurse spoke with him by phone on 04/21/16. He started seeing "floaters" in right eye in July 2017, and has been followed by his eye doctor.  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  Stated he was strongly encouraged to f/u with the Vascular surgeon, to check on his Carotid Disease.  Pt. denied any temporary loss of vision in eyes, unilateral weakness of extremities, difficulty with speech, unilateral facial droop, balance issues, or disorientation. His ophthalmologist is Dr. Johnn Hai, [facility]. No records received from her office.   He is walking an hour daily, and exercises in a gym 3 days/week. He denies claudication symptoms with walking.   Pt states at his PCP visit 2 weeks ago his blood pressure was normal at 120/60, is elevated now.   Pt Diabetic: no, states his A1C in August 2017 was 6.4 Pt smoker: former smoker, quit in 2000, was at 3 ppd, smoked for about 40 years  Pt meds include: Statin : yes ASA: yes Other anticoagulants/antiplatelets: no   Past Medical History:  Diagnosis Date  . Anemia   . Chronic kidney disease   . Diabetes mellitus    type 2  . GERD (gastroesophageal reflux disease)    under control  . Hypertension   . Murmur    had since childhood-no problems  . Sleep apnea    uses No CPAP    Social History Social History   Substance Use Topics  . Smoking status: Former Smoker    Packs/day: 3.00    Years: 40.00    Types: Cigarettes    Quit date: 06/18/1999  . Smokeless tobacco: Never Used  . Alcohol use Yes     Comment: rarely    Family History History reviewed. No pertinent family history.  Surgical History Past Surgical History:  Procedure Laterality Date  . CHOLECYSTECTOMY N/A 11/14/2014   Procedure: LAPAROSCOPIC CHOLECYSTECTOMY WITH INTRAOPERATIVE CHOLANGIOGRAM;  Surgeon: Johnathan Hausen, MD;  Location: [facility];  Service: General;  Laterality: N/A;  . COLONOSCOPY WITH PROPOFOL N/A 07/28/2014   Procedure: COLONOSCOPY WITH PROPOFOL;  Surgeon: Garlan Fair, MD;  Location: [facility];  Service: Endoscopy;  Laterality: N/A;  . ESOPHAGOGASTRODUODENOSCOPY (EGD) WITH PROPOFOL N/A 07/28/2014   Procedure: ESOPHAGOGASTRODUODENOSCOPY (EGD) WITH PROPOFOL;  Surgeon: Garlan Fair, MD;  Location: [facility];  Service: Endoscopy;  Laterality: N/A;  . NASAL SINUS SURGERY    . TONSILLECTOMY  age 73  . WISDOM TOOTH EXTRACTION  as teen    Allergies  Allergen Reactions  . Ambien [Zolpidem Tartrate]     "did not sit well with system..made me go crazy"  . Augmentin [Amoxicillin-Pot Clavulanate] Swelling    Tongue swells Patient states has tolerated amoxicillin  . Sulfa Antibiotics Swelling    Tongue swells    Current Outpatient Prescriptions  Medication Sig Dispense Refill  . Alpha-Lipoic Acid 600 MG CAPS Take 1  capsule by mouth every morning.     Marland Kitchen aspirin EC 81 MG tablet Take 81 mg by mouth at bedtime.    . canagliflozin (INVOKANA) 100 MG TABS tablet Take 100 mg by mouth at bedtime.    . cetirizine (ZYRTEC) 10 MG chewable tablet Chew 10 mg by mouth daily.    . Cholecalciferol (VITAMIN D) 2000 UNITS CAPS Take 1 capsule by mouth every morning.    . citalopram (CELEXA) 40 MG tablet Take 20 mg by mouth daily.     . cyanocobalamin (,VITAMIN B-12,) 1000 MCG/ML injection Inject 1,000 mcg into the muscle  every 30 (thirty) days.    . diphenhydrAMINE (SOMINEX) 25 MG tablet Take 25 mg by mouth at bedtime as needed for allergies.     . fenofibrate 160 MG tablet Take 160 mg by mouth at bedtime.     . Ferrous Sulfate (IRON) 325 (65 FE) MG TABS Take 1 tablet by mouth every morning.    . insulin aspart (NOVOLOG) 100 UNIT/ML injection Inject 28 Units into the skin 2 (two) times daily.     . insulin glargine (LANTUS) 100 UNIT/ML injection Inject 30 Units into the skin 2 (two) times daily.     Marland Kitchen lisinopril (PRINIVIL,ZESTRIL) 20 MG tablet Take 20 mg by mouth at bedtime.    . Magnesium 250 MG TABS Take 400 mg by mouth every morning.     . metoprolol succinate (TOPROL-XL) 50 MG 24 hr tablet Take 50 mg by mouth every morning. Take with or immediately following a meal.    . mirtazapine (REMERON) 15 MG tablet Take 15 mg by mouth at bedtime.    . Multiple Vitamin (MULTIVITAMIN WITH MINERALS) TABS tablet Take 1 tablet by mouth every morning.    . Omega-3 Fatty Acids (FISH OIL) 1200 MG CAPS Take 2 capsules by mouth 2 (two) times daily.     Marland Kitchen omeprazole (PRILOSEC) 20 MG capsule Take 20 mg by mouth 2 (two) times daily.    Marland Kitchen oxyCODONE (OXY IR/ROXICODONE) 5 MG immediate release tablet Take 1 tablet (5 mg total) by mouth every 3 (three) hours as needed for moderate pain or severe pain. (Patient not taking: Reported on 07/28/2015) 30 tablet 0  . pioglitazone (ACTOS) 30 MG tablet Take 30 mg by mouth every morning.    . potassium chloride SA (K-DUR,KLOR-CON) 20 MEQ tablet Take 20 mEq by mouth daily.     . psyllium (METAMUCIL) 58.6 % powder Take 1 packet by mouth at bedtime.    . rosuvastatin (CRESTOR) 10 MG tablet Take 10 mg by mouth every morning.    . testosterone cypionate (DEPOTESTOTERONE CYPIONATE) 200 MG/ML injection Inject 200 mg into the muscle every 28 (twenty-eight) days.    . traZODone (DESYREL) 50 MG tablet Take 50 mg by mouth at bedtime.     No current facility-administered medications for this visit.      Review of Systems : See HPI for pertinent positives and negatives.  Physical Examination  Vitals:   05/03/16 1002 05/03/16 1003  BP: (!) 168/73 (!) 154/74  Pulse: 63   Weight: 186 lb 12.8 oz (84.7 kg)   Height: 5\' 5"  (1.651 m)    Body mass index is 31.09 kg/m.  General: WDWN obese male in NAD GAIT: normal Eyes: PERRLA Pulmonary:  Respirations are non-labored, CTAB, good air movement. Cardiac: regular rhythm,  no detected murmur.  VASCULAR EXAM Carotid Bruits Right Left   Negative Positive    Aorta is not palpable. Radial pulses  are 2+ palpable and equal.                                                                                                                            LE Pulses Right Left       POPLITEAL  not palpable   not palpable       POSTERIOR TIBIAL   palpable    palpable        DORSALIS PEDIS      ANTERIOR TIBIAL  palpable   palpable     Gastrointestinal: soft, nontender, BS WNL, no r/g, no palpable masses.  Musculoskeletal: no muscle atrophy/wasting. M/S 5/5 throughout, extremities without ischemic changes.  Neurologic: A&O X 3; Appropriate Affect, Speech is normal CN 2-12 intact, pain and light touch intact in extremities, Motor exam as listed above.     Assessment: RAELYN SISNEROS is a 70 y.o. male who has no history of stroke or ITA. He has a left carotid bruit. He returns today at the request of his ophthalmologist, pt states she saw something in a blood vessel in his right eye that needs further evaluation by [place].  DATA 04/27/16 carotid duplex suggests 40-59% right ICA stenosis and <40% left ICA stenosis. No significant change from duplex of 07/28/15.   I discussed with Dr. Donnetta Hutching pt's HPI, carotid duplex result, and physical exam results. I asked pt to ask his ophthalmologist office to fax her last office visit of pt to [place].   We later received Dr. Wandra Scot note dated 04/21/16. 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 He will make the appointment to be re-evaluated as soon as possible. He is to go straight to the Emergency Room if any s/s's of stroke occurs.   I spoke with Dr. Donnetta Hutching and reviewed Dr. Wandra Scot note; he advised a CTA of the neck.  I then spoke with pt by phone and explained the reason for the CTA of his neck and he agreed. Someone will call him to schedule this.  Plan: Will schedule CTA of neck within the next couple of weeks, follow up with Dr. Donnetta Hutching after this.   I discussed in depth with the patient the nature of atherosclerosis, and emphasized the importance of maximal medical management including strict control of blood pressure, blood glucose, and lipid levels, obtaining regular exercise, and continued cessation of smoking.  The patient is aware that without maximal medical management the underlying atherosclerotic disease process will progress, limiting the benefit of any interventions. The patient was given information about stroke prevention and what symptoms should prompt the patient to seek immediate medical care. Thank you for allowing [place] to participate in this patient's care.  Clemon Chambers, RN, MSN, FNP-C [facility] [place] Office: [phone]  Clinic Physician: Early  05/03/16 10:12 AM

## 2016-05-05 ENCOUNTER — Other Ambulatory Visit: Payer: Self-pay | Admitting: *Deleted

## 2016-05-05 ENCOUNTER — Telehealth: Payer: Self-pay | Admitting: Vascular Surgery

## 2016-05-05 DIAGNOSIS — H34211 Partial retinal artery occlusion, right eye: Secondary | ICD-10-CM

## 2016-05-05 NOTE — Telephone Encounter (Signed)
FW: records from opthalmologist office  Received: Yesterday  Message Contents  Reola Calkins, [place] [facility] Admin Pool      Previous Messages    ----- Message -----  From: Viann Fish, NP  Sent: 05/03/2016 10:56 AM  To: Rosetta Posner, MD, *  Subject: records from opthalmologist office        [place],  Please request records from pt last visit from his ophthalmologist, Dr. Johnn Hai, [facility].  In regards to the reason she asked him to return here for re-evaluation.  Thank you,  Vinnie Level

## 2016-05-05 NOTE — Telephone Encounter (Signed)
-----   Message from Viann Fish, NP sent at 05/03/2016  6:57 PM EDT ----- Regarding: schedule CTA of neck Please call pt to schedule CTA of his neck. Indication is Hollenhorst plaque, right eye.  I spoke with pt and he is expecting a call to schedule this. Thank you, Vinnie Level

## 2016-05-05 NOTE — Telephone Encounter (Signed)
Pt out of town until 05/21/16. Sched appts 05/24/16:  CTA Neck at [place] [facility] at 12:00  NP at 1:15  Spoke to pt to inform them of appt.  Tried to contact Dr. Wandra Scot office. They had an automated message saying they were closed b/c of the storm warnings and would not be open again 05/11/16 depending on the weather.

## 2016-05-09 DIAGNOSIS — Z23 Encounter for immunization: Secondary | ICD-10-CM | POA: Diagnosis not present

## 2016-05-09 DIAGNOSIS — E291 Testicular hypofunction: Secondary | ICD-10-CM | POA: Diagnosis not present

## 2016-05-09 DIAGNOSIS — D649 Anemia, unspecified: Secondary | ICD-10-CM | POA: Diagnosis not present

## 2016-05-17 ENCOUNTER — Encounter: Payer: Self-pay | Admitting: Family

## 2016-05-17 ENCOUNTER — Other Ambulatory Visit: Payer: Medicare Other

## 2016-05-23 ENCOUNTER — Other Ambulatory Visit: Payer: Self-pay | Admitting: Nephrology

## 2016-05-23 DIAGNOSIS — N183 Chronic kidney disease, stage 3 unspecified: Secondary | ICD-10-CM

## 2016-05-23 DIAGNOSIS — D649 Anemia, unspecified: Secondary | ICD-10-CM | POA: Diagnosis not present

## 2016-05-23 DIAGNOSIS — E291 Testicular hypofunction: Secondary | ICD-10-CM | POA: Diagnosis not present

## 2016-05-23 DIAGNOSIS — Z6830 Body mass index (BMI) 30.0-30.9, adult: Secondary | ICD-10-CM | POA: Diagnosis not present

## 2016-05-24 ENCOUNTER — Encounter: Payer: Self-pay | Admitting: Family

## 2016-05-24 ENCOUNTER — Other Ambulatory Visit: Payer: Self-pay

## 2016-05-24 ENCOUNTER — Ambulatory Visit
Admission: RE | Admit: 2016-05-24 | Discharge: 2016-05-24 | Disposition: A | Discharge status: Home / Self Care | Payer: Medicare Other | Source: Ambulatory Visit | Attending: Vascular Surgery | Admitting: Vascular Surgery

## 2016-05-24 ENCOUNTER — Ambulatory Visit (INDEPENDENT_AMBULATORY_CARE_PROVIDER_SITE_OTHER): Payer: Medicare Other | Admitting: Family

## 2016-05-24 ENCOUNTER — Ambulatory Visit
Admission: RE | Admit: 2016-05-24 | Discharge: 2016-05-24 | Disposition: A | Discharge status: Home / Self Care | Payer: Medicare Other | Source: Ambulatory Visit | Attending: Nephrology | Admitting: Nephrology

## 2016-05-24 VITALS — BP 139/67 | HR 66 | Temp 98.2°F | Resp 16 | Ht 65.0 in | Wt 188.0 lb

## 2016-05-24 DIAGNOSIS — N183 Chronic kidney disease, stage 3 unspecified: Secondary | ICD-10-CM

## 2016-05-24 DIAGNOSIS — I6523 Occlusion and stenosis of bilateral carotid arteries: Secondary | ICD-10-CM

## 2016-05-24 DIAGNOSIS — H34211 Partial retinal artery occlusion, right eye: Secondary | ICD-10-CM

## 2016-05-24 MED ORDER — IOPAMIDOL (ISOVUE-370) INJECTION 76%
60.0000 mL | Freq: Once | INTRAVENOUS | Status: AC | PRN
  Administered 2016-05-24: 60 mL via INTRAVENOUS

## 2016-05-24 NOTE — Progress Notes (Signed)
Chief Complaint: Follow up Extracranial Carotid Artery Stenosis   History of Present Illness  Javier Potter is a 70 y.o. male patient of Dr. Donnetta Hutching whom he saw in November 2016 on referral. Patient was found to have soft bilateral carotid bruits and also reported bilateral pulsatile tinnitus, but he denies ever having this. He specifically denied any prior history of amaurosis fugax, transient ischemic attack, aphasia or stroke. He has no significant history of cardiac disease. He does have a history of heart murmur.  He returns today for discussion of results of the CTA of his neck performed this morning. He saw a nephrologist yesterday with [facility], states he had a renal ultrasound today.   He started seeing "floaters" in his right eye in July 2017, and has been followed by his eye doctor. 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 Stated he was strongly encouraged to f/u with the Vascular surgeon, to check on his Carotid Disease. Pt. denied any temporary loss of vision in eyes, denied unilateral weakness of extremities, denied difficulty with speech, denied unilateral facial droop, denied balance issues, or disorientation. His ophthalmologist is Dr. Johnn Hai, [facility].  Review of records from Dr. 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 He will make the appointment to be re-evaluated as soon as possible. He is to go straight to the Emergency Room if any s/s's of stroke occurs.  He is walking an hour daily, and exercises in a gym 3 days/week. He denies claudication symptoms with walking.   Pt Diabetic: yes, states his A1C in August 2017 was 6.4 Pt smoker: former smoker, quit in 2000, was up to 3 ppd, smoked for about 40 years  Pt meds include:  Statin : yes ASA:  yes Other anticoagulants/antiplatelets: no   Past Medical History:  Diagnosis Date  . Anemia   . Chronic kidney disease   . Diabetes mellitus    type 2  . GERD (gastroesophageal reflux disease)    under control  . Hypertension   . Murmur    had since childhood-no problems  . Sleep apnea    uses No CPAP    Social History Social History  Substance Use Topics  . Smoking status: Former Smoker    Packs/day: 3.00    Years: 40.00    Types: Cigarettes    Quit date: 06/18/1999  . Smokeless tobacco: Never Used  . Alcohol use Yes     Comment: rarely    Family History No family history on file.  Surgical History Past Surgical History:  Procedure Laterality Date  . CHOLECYSTECTOMY N/A 11/14/2014   Procedure: LAPAROSCOPIC CHOLECYSTECTOMY WITH INTRAOPERATIVE CHOLANGIOGRAM;  Surgeon: Johnathan Hausen, MD;  Location: [facility];  Service: General;  Laterality: N/A;  . COLONOSCOPY WITH PROPOFOL N/A 07/28/2014   Procedure: COLONOSCOPY WITH PROPOFOL;  Surgeon: Garlan Fair, MD;  Location: [facility];  Service: Endoscopy;  Laterality: N/A;  . ESOPHAGOGASTRODUODENOSCOPY (EGD) WITH PROPOFOL N/A 07/28/2014   Procedure: ESOPHAGOGASTRODUODENOSCOPY (EGD) WITH PROPOFOL;  Surgeon: Garlan Fair, MD;  Location: [facility];  Service: Endoscopy;  Laterality: N/A;  . NASAL SINUS SURGERY    . TONSILLECTOMY  age 64  . WISDOM TOOTH EXTRACTION  as teen    Allergies  Allergen Reactions  . Ambien [Zolpidem Tartrate]     "did not sit well  with system..made me go crazy"  . Augmentin [Amoxicillin-Pot Clavulanate] Swelling    Tongue swells Patient states has tolerated amoxicillin  . Sulfa Antibiotics Swelling    Tongue swells    Current Outpatient Prescriptions  Medication Sig Dispense Refill  . Alpha-Lipoic Acid 600 MG CAPS Take 1 capsule by mouth every morning.     Marland Kitchen aspirin EC 81 MG tablet Take 81 mg by mouth at bedtime.    . cetirizine (ZYRTEC) 10 MG chewable tablet Chew 10 mg by mouth  daily.    . Cholecalciferol (VITAMIN D) 2000 UNITS CAPS Take 1 capsule by mouth every morning.    . citalopram (CELEXA) 40 MG tablet Take 20 mg by mouth daily.     . cyanocobalamin (,VITAMIN B-12,) 1000 MCG/ML injection Inject 1,000 mcg into the muscle every 30 (thirty) days.    . diphenhydrAMINE (SOMINEX) 25 MG tablet Take 25 mg by mouth at bedtime as needed for allergies.     . fenofibrate 160 MG tablet Take 160 mg by mouth at bedtime.     . Ferrous Sulfate (IRON) 325 (65 FE) MG TABS Take 1 tablet by mouth every morning.    . insulin aspart (NOVOLOG) 100 UNIT/ML injection Inject 28 Units into the skin 2 (two) times daily.     . insulin glargine (LANTUS) 100 UNIT/ML injection Inject 30 Units into the skin 2 (two) times daily.     Marland Kitchen lisinopril (PRINIVIL,ZESTRIL) 20 MG tablet Take 20 mg by mouth at bedtime.    . Magnesium 250 MG TABS Take 400 mg by mouth every morning.     . metoprolol succinate (TOPROL-XL) 50 MG 24 hr tablet Take 50 mg by mouth every morning. Take with or immediately following a meal.    . Multiple Vitamin (MULTIVITAMIN WITH MINERALS) TABS tablet Take 1 tablet by mouth every morning.    . Omega-3 Fatty Acids (FISH OIL) 1200 MG CAPS Take 2 capsules by mouth 2 (two) times daily.     Marland Kitchen omeprazole (PRILOSEC) 20 MG capsule Take 20 mg by mouth 2 (two) times daily.    Marland Kitchen oxyCODONE (OXY IR/ROXICODONE) 5 MG immediate release tablet Take 1 tablet (5 mg total) by mouth every 3 (three) hours as needed for moderate pain or severe pain. 30 tablet 0  . pioglitazone (ACTOS) 30 MG tablet Take 30 mg by mouth every morning.    . potassium chloride SA (K-DUR,KLOR-CON) 20 MEQ tablet Take 20 mEq by mouth daily.     . psyllium (METAMUCIL) 58.6 % powder Take 1 packet by mouth at bedtime.    . rosuvastatin (CRESTOR) 10 MG tablet Take 10 mg by mouth every morning.    . testosterone cypionate (DEPOTESTOTERONE CYPIONATE) 200 MG/ML injection Inject 200 mg into the muscle every 28 (twenty-eight) days.    .  traZODone (DESYREL) 50 MG tablet Take 50 mg by mouth at bedtime.     No current facility-administered medications for this visit.     Review of Systems : See HPI for pertinent positives and negatives.  Physical Examination  Vitals:   05/24/16 1336 05/24/16 1338  BP: 123/66 139/67  Pulse: 66   Resp: 16   Temp: 98.2 F (36.8 C)   TempSrc: Oral   SpO2: 93%   Weight: 188 lb (85.3 kg)   Height: 5\' 5"  (1.651 m)    Body mass index is 31.28 kg/m.  General: WDWN obese male in NAD GAIT: normal Eyes: PERRLA Pulmonary:  Respirations are non-labored, CTAB, good air movement  in all fields. Cardiac: regular rhythm and rate,  no detected murmur.  VASCULAR EXAM Carotid Bruits Right Left   positive Positive    Aorta is not palpable. Radial pulses are 2+ palpable and equal.                                                                                                                                          LE Pulses Right Left       POPLITEAL  not palpable  not palpable       POSTERIOR TIBIAL   palpable   palpable       DORSALIS PEDIS      ANTERIOR TIBIAL  palpable  palpable    Gastrointestinal: soft, nontender, BS WNL, no r/g, no palpable masses.  Musculoskeletal: no muscle atrophy/wasting. M/S 5/5 throughout, extremities without ischemic changes.  Neurologic: A&O X 3; Appropriate Affect, Speech is normal CN 2-12 intact, pain and light touch intact in extremities, Motor exam as listed above.    Assessment: Javier Potter is a 70 y.o. male who has no history of stroke or ITA. He has bilateral carotid bruits. He has no history of stroke or TIA. He reports floaters in his right eye in July 2017.  His opthalmologist found possible Hollenhorst plaque in his right eye on exam on 04/21/16.  11/15/14 serum creatinine result on file was 1.54.  He is under evaluation by [facility], states he was told that he has Stage 3 CKD.   Dr. Donnetta Hutching spoke with and examined pt. Dr.  Donnetta Hutching also reviewed pt's echocardiogram results from 06/06/14.  Pt's atherosclerotic risk factors include former 3 ppd smoker (smoked for 40 years, quit in 2003), DM with an A1C of 6.4, and obesity. Fortunately he has no known CAD.    DATA 04/27/16 carotid duplex suggests 40-59% right ICA stenosis and <40% left ICA stenosis. No significant change from duplex of 07/28/15.   Dr. Donnetta Hutching reviewed CTA neck images from CTA performed earlier today.  Dr. Donnetta Hutching estimates right ICA stenosis at about 70%.  Radiologist report available online later today, CTA neck (05/24/16): 60% diameter stenosis proximal right internal carotid artery. 25% diameter stenosis proximal left internal carotid artery. Severe stenosis at the origin of the left external carotid artery Mild atherosclerotic disease in the vertebral artery bilaterally. Left vertebral artery does not contribute to the basilar and ends in PICA.    Plan: Pt will be scheduled for right CEA by Dr. Donnetta Hutching.  I discussed in depth with the patient the nature of atherosclerosis, and emphasized the importance of maximal medical management including strict control of blood pressure, blood glucose, and lipid levels, obtaining regular exercise, and continued cessation of smoking.  The patient is aware that without maximal medical management the underlying atherosclerotic disease process will progress, limiting the benefit of any interventions. The patient was given information about stroke prevention and what  symptoms should prompt the patient to seek immediate medical care. Thank you for allowing [place] to participate in this patient's care.  Clemon Chambers, RN, MSN, FNP-C [facility] Office: [phone]  Clinic Physician: Early  05/24/16 2:16 PM

## 2016-05-24 NOTE — Patient Instructions (Signed)
Stroke Prevention Some medical conditions and behaviors are associated with an increased chance of having a stroke. You may prevent a stroke by making healthy choices and managing medical conditions. HOW CAN I REDUCE MY RISK OF HAVING A STROKE?   Stay physically active. Get at least 30 minutes of activity on most or all days.  Do not smoke. It may also be helpful to avoid exposure to secondhand smoke.  Limit alcohol use. Moderate alcohol use is considered to be:  No more than 2 drinks per day for men.  No more than 1 drink per day for nonpregnant women.  Eat healthy foods. This involves:  Eating 5 or more servings of fruits and vegetables a day.  Making dietary changes that address high blood pressure (hypertension), high cholesterol, diabetes, or obesity.  Manage your cholesterol levels.  Making food choices that are high in fiber and low in saturated fat, trans fat, and cholesterol may control cholesterol levels.  Take any prescribed medicines to control cholesterol as directed by your health care provider.  Manage your diabetes.  Controlling your carbohydrate and sugar intake is recommended to manage diabetes.  Take any prescribed medicines to control diabetes as directed by your health care provider.  Control your hypertension.  Making food choices that are low in salt (sodium), saturated fat, trans fat, and cholesterol is recommended to manage hypertension.  Ask your health care provider if you need treatment to lower your blood pressure. Take any prescribed medicines to control hypertension as directed by your health care provider.  If you are 18-39 years of age, have your blood pressure checked every 3-5 years. If you are 40 years of age or older, have your blood pressure checked every year.  Maintain a healthy weight.  Reducing calorie intake and making food choices that are low in sodium, saturated fat, trans fat, and cholesterol are recommended to manage  weight.  Stop drug abuse.  Avoid taking birth control pills.  Talk to your health care provider about the risks of taking birth control pills if you are over 35 years old, smoke, get migraines, or have ever had a blood clot.  Get evaluated for sleep disorders (sleep apnea).  Talk to your health care provider about getting a sleep evaluation if you snore a lot or have excessive sleepiness.  Take medicines only as directed by your health care provider.  For some people, aspirin or blood thinners (anticoagulants) are helpful in reducing the risk of forming abnormal blood clots that can lead to stroke. If you have the irregular heart rhythm of atrial fibrillation, you should be on a blood thinner unless there is a good reason you cannot take them.  Understand all your medicine instructions.  Make sure that other conditions (such as anemia or atherosclerosis) are addressed. SEEK IMMEDIATE MEDICAL CARE IF:   You have sudden weakness or numbness of the face, arm, or leg, especially on one side of the body.  Your face or eyelid droops to one side.  You have sudden confusion.  You have trouble speaking (aphasia) or understanding.  You have sudden trouble seeing in one or both eyes.  You have sudden trouble walking.  You have dizziness.  You have a loss of balance or coordination.  You have a sudden, severe headache with no known cause.  You have new chest pain or an irregular heartbeat. Any of these symptoms may represent a serious problem that is an emergency. Do not wait to see if the symptoms will   go away. Get medical help at once. Call your local emergency services (911 in [place] Do not drive yourself to the hospital.   This information is not intended to replace advice given to you by your health care provider. Make sure you discuss any questions you have with your health care provider.   Document Released: 09/22/2004 Document Revised: 09/05/2014 Document Reviewed:  02/15/2013 Elsevier Interactive Patient Education 2016 Elsevier Inc.  

## 2016-06-06 DIAGNOSIS — D509 Iron deficiency anemia, unspecified: Secondary | ICD-10-CM | POA: Diagnosis not present

## 2016-06-06 DIAGNOSIS — E291 Testicular hypofunction: Secondary | ICD-10-CM | POA: Diagnosis not present

## 2016-06-13 DIAGNOSIS — Z125 Encounter for screening for malignant neoplasm of prostate: Secondary | ICD-10-CM | POA: Diagnosis not present

## 2016-06-13 DIAGNOSIS — D81818 Other biotin-dependent carboxylase deficiency: Secondary | ICD-10-CM | POA: Diagnosis not present

## 2016-06-13 DIAGNOSIS — I779 Disorder of arteries and arterioles, unspecified: Secondary | ICD-10-CM | POA: Diagnosis not present

## 2016-06-13 DIAGNOSIS — M19049 Primary osteoarthritis, unspecified hand: Secondary | ICD-10-CM | POA: Diagnosis not present

## 2016-06-13 DIAGNOSIS — K760 Fatty (change of) liver, not elsewhere classified: Secondary | ICD-10-CM | POA: Diagnosis not present

## 2016-06-13 DIAGNOSIS — Z0000 Encounter for general adult medical examination without abnormal findings: Secondary | ICD-10-CM | POA: Diagnosis not present

## 2016-06-13 DIAGNOSIS — Z794 Long term (current) use of insulin: Secondary | ICD-10-CM | POA: Diagnosis not present

## 2016-06-13 DIAGNOSIS — N183 Chronic kidney disease, stage 3 (moderate): Secondary | ICD-10-CM | POA: Diagnosis not present

## 2016-06-13 DIAGNOSIS — Z23 Encounter for immunization: Secondary | ICD-10-CM | POA: Diagnosis not present

## 2016-06-13 DIAGNOSIS — Z1389 Encounter for screening for other disorder: Secondary | ICD-10-CM | POA: Diagnosis not present

## 2016-06-13 DIAGNOSIS — K7460 Unspecified cirrhosis of liver: Secondary | ICD-10-CM | POA: Diagnosis not present

## 2016-06-13 DIAGNOSIS — E559 Vitamin D deficiency, unspecified: Secondary | ICD-10-CM | POA: Diagnosis not present

## 2016-06-13 DIAGNOSIS — E1165 Type 2 diabetes mellitus with hyperglycemia: Secondary | ICD-10-CM | POA: Diagnosis not present

## 2016-06-13 DIAGNOSIS — D72819 Decreased white blood cell count, unspecified: Secondary | ICD-10-CM | POA: Diagnosis not present

## 2016-06-13 DIAGNOSIS — I739 Peripheral vascular disease, unspecified: Secondary | ICD-10-CM | POA: Diagnosis not present

## 2016-06-13 DIAGNOSIS — E291 Testicular hypofunction: Secondary | ICD-10-CM | POA: Diagnosis not present

## 2016-06-15 ENCOUNTER — Encounter (HOSPITAL_COMMUNITY)
Admission: RE | Admit: 2016-06-15 | Discharge: 2016-06-15 | Disposition: A | Discharge status: Home / Self Care | Payer: Medicare Other | Source: Ambulatory Visit | Attending: Vascular Surgery | Admitting: Vascular Surgery

## 2016-06-15 ENCOUNTER — Encounter (HOSPITAL_COMMUNITY): Payer: Self-pay

## 2016-06-15 DIAGNOSIS — Z01818 Encounter for other preprocedural examination: Secondary | ICD-10-CM | POA: Insufficient documentation

## 2016-06-15 DIAGNOSIS — I10 Essential (primary) hypertension: Secondary | ICD-10-CM | POA: Insufficient documentation

## 2016-06-15 DIAGNOSIS — E119 Type 2 diabetes mellitus without complications: Secondary | ICD-10-CM | POA: Insufficient documentation

## 2016-06-15 HISTORY — DX: Unspecified osteoarthritis, unspecified site: M19.90

## 2016-06-15 LAB — URINALYSIS, ROUTINE W REFLEX MICROSCOPIC
BILIRUBIN URINE: NEGATIVE
Glucose, UA: NEGATIVE mg/dL
HGB URINE DIPSTICK: NEGATIVE
KETONES UR: NEGATIVE mg/dL
Leukocytes, UA: NEGATIVE
Nitrite: NEGATIVE
PROTEIN: 30 mg/dL — AB
Specific Gravity, Urine: 1.009 (ref 1.005–1.030)
pH: 5.5 (ref 5.0–8.0)

## 2016-06-15 LAB — TYPE AND SCREEN
ABO/RH(D): A POS
ANTIBODY SCREEN: NEGATIVE

## 2016-06-15 LAB — PROTIME-INR
INR: 1.13
Prothrombin Time: 14.6 seconds (ref 11.4–15.2)

## 2016-06-15 LAB — GLUCOSE, CAPILLARY: Glucose-Capillary: 97 mg/dL (ref 65–99)

## 2016-06-15 LAB — COMPREHENSIVE METABOLIC PANEL
ALBUMIN: 4.3 g/dL (ref 3.5–5.0)
ALK PHOS: 40 U/L (ref 38–126)
ALT: 45 U/L (ref 17–63)
ANION GAP: 7 (ref 5–15)
AST: 36 U/L (ref 15–41)
BUN: 20 mg/dL (ref 6–20)
CALCIUM: 10.2 mg/dL (ref 8.9–10.3)
CHLORIDE: 107 mmol/L (ref 101–111)
CO2: 27 mmol/L (ref 22–32)
Creatinine, Ser: 1.41 mg/dL — ABNORMAL HIGH (ref 0.61–1.24)
GFR calc Af Amer: 57 mL/min — ABNORMAL LOW (ref >60)
GFR calc non Af Amer: 49 mL/min — ABNORMAL LOW (ref >60)
GLUCOSE: 80 mg/dL (ref 65–99)
POTASSIUM: 4.4 mmol/L (ref 3.5–5.1)
SODIUM: 141 mmol/L (ref 135–145)
Total Bilirubin: 0.7 mg/dL (ref 0.3–1.2)
Total Protein: 6.8 g/dL (ref 6.5–8.1)

## 2016-06-15 LAB — CBC
HCT: 47.6 % (ref 39.0–52.0)
HEMOGLOBIN: 15.6 g/dL (ref 13.0–17.0)
MCH: 29.2 pg (ref 26.0–34.0)
MCHC: 32.8 g/dL (ref 30.0–36.0)
MCV: 89 fL (ref 78.0–100.0)
Platelets: 84 10*3/uL — ABNORMAL LOW (ref 150–400)
RBC: 5.35 MIL/uL (ref 4.22–5.81)
RDW: 13.5 % (ref 11.5–15.5)
WBC: 4.5 10*3/uL (ref 4.0–10.5)

## 2016-06-15 LAB — URINE MICROSCOPIC-ADD ON
Bacteria, UA: NONE SEEN
RBC / HPF: NONE SEEN RBC/hpf (ref 0–5)
WBC, UA: NONE SEEN WBC/hpf (ref 0–5)

## 2016-06-15 LAB — SURGICAL PCR SCREEN
MRSA, PCR: NEGATIVE
STAPHYLOCOCCUS AUREUS: NEGATIVE

## 2016-06-15 LAB — APTT: APTT: 31 s (ref 24–36)

## 2016-06-15 LAB — ABO/RH: ABO/RH(D): A POS

## 2016-06-15 NOTE — Progress Notes (Signed)
PCP:Dr. Rennis Petty  Renal: [facility]-doesn't remember the name of doctor  No cardiologist.  Endocrinologist: Dr. Buddy Duty @ [facility]

## 2016-06-15 NOTE — Progress Notes (Addendum)
Anesthesia Chart Review:  Pt is a 70 year old male scheduled for R CEA on 06/22/2016 with Curt Jews, MD.   - PCP is Mertha Finders, MD  PMH includes:  HTN, heart murmur (from childhood), DM, OSA, CKD, GERD. Former smoker. BMI 30.  S/p cholecystectomy 11/14/14.   Medications include: ASA, fenofibrate, iron, NovoLog, Lantus, lisinopril, metoprolol, Prilosec, potassium, Crestor, sildenafil.   Preoperative labs reviewed.   - Cr 1.41, BUN 20 - Platelets 84. LFTs and PT/PTT normal. Most recent results from PCP's office 06/13/16 show a platelet count of 96. I notified Zigmund Daniel in Dr. Luther Parody office of low platelets. She called back- Dr. Donnetta Hutching wants to proceed if platelets remain over 47.  Will recheck CBC DOS.  I also routed CBC results to PCP for follow up.   EKG 06/15/16: NSR.   Carotid duplex 04/27/16:  1. 40-59% R ICA stenosis. 2. 1-39% L ICA stenosis.  Echo 06/06/14:  - Left ventricle: The cavity size was normal. Wall thickness was increased in a pattern of mild LVH. Systolic function was vigorous. The estimated ejection fraction was in the range of 65%to 70%. Wall motion was normal; there were no regional wall motion abnormalities. Doppler parameters are consistent with abnormal left ventricular relaxation (grade 1 diastolicdysfunction). The E/e&' ratio is between 8-15, suggesting indeterminate LV filling pressure. - Aortic valve: Trileaflet; mildly calcified leaflets. Transvalvular velocity was minimally increased. There was no stenosis. There was no regurgitation. Mean gradient (S): 11 mmHg. Peak gradient (S): 24 mm Hg. - Left atrium: The atrium was normal in size. - Inferior vena cava: The IVC is small (<1.2 cm) and spontaneously collapses, suggesting possible dehydration and low RA pressure of <3 mmHg.  Nuclear stress test 12/05/13: Low risk stress nuclear study Evidence for LVH and mild diffuse hypokinesis EF 46%  No perfusion defects.  If labs acceptable DLS, I anticipate patient can  proceed with surgery as scheduled.  Willeen Cass, FNP-BC [facility]/Anesthesiology Phone: [phone] 06/15/2016 3:46 PM

## 2016-06-15 NOTE — Pre-Procedure Instructions (Addendum)
Javier Potter  06/15/2016      [facility] - [place], [place] - [address] AT [address] [place] [address] Phone: [phone] Fax: [phone]    Your procedure is scheduled on Wed. Oct 25  Report to [facility] Admitting at 6:30 A.M.  Call this number if you have problems the morning of surgery:  [phone]   Remember:  Do not eat food or drink liquids after midnight.  Take these medicines the morning of surgery with A SIP OF WATER :tylenol if needed, certirizine (zyrtec),  Citalopram (clexa), metoprolol (toprol-xl), omeprazole (prilosec)             1 week prior to surgery stop: advil, motrin, ibuprofen, aleve, BC Powders, Goody's, fish oil, herbal medicines/vitamins     How to Manage Your Diabetes Before and After Surgery  Why is it important to control my blood sugar before and after surgery? . Improving blood sugar levels before and after surgery helps healing and can limit problems. . A way of improving blood sugar control is eating a healthy diet by: o  Eating less sugar and carbohydrates o  Increasing activity/exercise o  Talking with your doctor about reaching your blood sugar goals . High blood sugars (greater than 180 mg/dL) can raise your risk of infections and slow your recovery, so you will need to focus on controlling your diabetes during the weeks before surgery. . Make sure that the doctor who takes care of your diabetes knows about your planned surgery including the date and location.  How do I manage my blood sugar before surgery? . Check your blood sugar at least 4 times a day, starting 2 days before surgery, to make sure that the level is not too high or low. o Check your blood sugar the morning of your surgery when you wake up and every 2 hours until you get to the Short Stay unit. . If your blood sugar is less than 70 mg/dL, you will need to treat for low blood sugar: o Do  not take insulin. o Treat a low blood sugar (less than 70 mg/dL) with  cup of clear juice (cranberry or apple), 4 glucose tablets, OR glucose gel. o Recheck blood sugar in 15 minutes after treatment (to make sure it is greater than 70 mg/dL). If your blood sugar is not greater than 70 mg/dL on recheck, call [phone] for further instructions. . Report your blood sugar to the short stay nurse when you get to Short Stay.  . If you are admitted to the hospital after surgery: o Your blood sugar will be checked by the staff and you will probably be given insulin after surgery (instead of oral diabetes medicines) to make sure you have good blood sugar levels. o The goal for blood sugar control after surgery is 80-180 mg/dL.              WHAT DO I DO ABOUT MY DIABETES MEDICATION?   Marland Kitchen Do not take oral diabetes medicines (pills) the morning of surgery.  . THE NIGHT BEFORE SURGERY, take ___15________ units of _______lantus____insulin.       Marland Kitchen HE MORNING OF SURGERY, take _____15________ units of ____lantus______insulin.                            If CBG greater than 220 mg/dl, may take 1/2 of usual correction dose the morning  of surgery.     Do not wear jewelry.  Do not wear lotions, powders, or cologne, or deoderant.  Do not shave 48 hours prior to surgery.  Men may shave face and neck.  Do not bring valuables to the hospital.  [facility] is not responsible for any belongings or valuables.  Contacts, dentures or bridgework may not be worn into surgery.  Leave your suitcase in the car.  After surgery it may be brought to your room.  For patients admitted to the hospital, discharge time will be determined by your treatment team.  Patients discharged the day of surgery will not be allowed to drive home.    Special instructions:  Review preparing for surgery handout  Please read over the following fact sheets that you were given. Coughing and Deep Breathing and MRSA  Information

## 2016-06-21 NOTE — Anesthesia Preprocedure Evaluation (Addendum)
Anesthesia Evaluation  Patient identified by MRN, date of birth, ID band Patient awake    Reviewed: Allergy & Precautions, NPO status , Patient's Chart, lab work & pertinent test results  Airway Mallampati: II  TM Distance: >3 FB Neck ROM: Full    Dental  (+) Dental Advisory Given   Pulmonary sleep apnea , former smoker,    breath sounds clear to auscultation       Cardiovascular hypertension, Pt. on medications and Pt. on home beta blockers  Rhythm:Regular Rate:Normal  Echo 06/06/14:  - Left ventricle: The cavity size was normal. Wall thickness was increased in a pattern of mild LVH. Systolic function was vigorous. The estimated ejection fraction was in the range of 65%to 70%. Wall motion was normal; there were no regional wall motion abnormalities. Doppler parameters are consistent with abnormal left ventricular relaxation (grade 1 diastolicdysfunction). The E/e&' ratio is between 8-15, suggesting indeterminate LV filling pressure. - Aortic valve: Trileaflet; mildly calcified leaflets. Transvalvular velocity was minimally increased. There was no stenosis. There was no regurgitation. Mean gradient (S): 11 mmHg. Peak gradient (S): 24 mm Hg. - Left atrium: The atrium was normal in size. - Inferior vena cava: The IVC is small (<1.2 cm) and spontaneously collapses, suggesting possible dehydration and low RA pressure of <3 mmHg.  Nuclear stress test 12/05/13: Low risk stress nuclear study Evidence for LVH and mild diffuse hypokinesis EF 46% No perfusion defects.   Neuro/Psych negative neurological ROS     GI/Hepatic Neg liver ROS, GERD  ,  Endo/Other  diabetes  Renal/GU CRFRenal disease     Musculoskeletal  (+) Arthritis ,   Abdominal   Peds  Hematology  (+) Blood dyscrasia (Thrombocytopenia), ,   Anesthesia Other Findings   Reproductive/Obstetrics                            Lab Results   Component Value Date   WBC 4.1 06/22/2016   HGB 14.7 06/22/2016   HCT 45.4 06/22/2016   MCV 89.2 06/22/2016   PLT 79 (L) 06/22/2016   Lab Results  Component Value Date   CREATININE 1.41 (H) 06/15/2016   BUN 20 06/15/2016   NA 141 06/15/2016   K 4.4 06/15/2016   CL 107 06/15/2016   CO2 27 06/15/2016    Anesthesia Physical Anesthesia Plan  ASA: III  Anesthesia Plan: General   Post-op Pain Management:    Induction: Intravenous  Airway Management Planned: Oral ETT  Additional Equipment: Arterial line  Intra-op Plan:   Post-operative Plan:   Informed Consent: I have reviewed the patients History and Physical, chart, labs and discussed the procedure including the risks, benefits and alternatives for the proposed anesthesia with the patient or authorized representative who has indicated his/her understanding and acceptance.   Dental advisory given  Plan Discussed with:   Anesthesia Plan Comments:        Anesthesia Quick Evaluation

## 2016-06-22 ENCOUNTER — Encounter (HOSPITAL_COMMUNITY)
Admission: RE | Disposition: A | Discharge status: Home / Self Care | Payer: Self-pay | Source: Ambulatory Visit | Attending: Vascular Surgery

## 2016-06-22 ENCOUNTER — Encounter (HOSPITAL_COMMUNITY): Payer: Self-pay | Admitting: *Deleted

## 2016-06-22 ENCOUNTER — Inpatient Hospital Stay (HOSPITAL_COMMUNITY): Payer: Medicare Other | Admitting: Emergency Medicine

## 2016-06-22 ENCOUNTER — Inpatient Hospital Stay (HOSPITAL_COMMUNITY)
Admission: RE | Admit: 2016-06-22 | Discharge: 2016-06-23 | DRG: 039 | Disposition: A | Discharge status: Home / Self Care | Payer: Medicare Other | Source: Ambulatory Visit | Attending: Vascular Surgery | Admitting: Vascular Surgery

## 2016-06-22 DIAGNOSIS — E1122 Type 2 diabetes mellitus with diabetic chronic kidney disease: Secondary | ICD-10-CM | POA: Diagnosis present

## 2016-06-22 DIAGNOSIS — Z7982 Long term (current) use of aspirin: Secondary | ICD-10-CM

## 2016-06-22 DIAGNOSIS — Z87891 Personal history of nicotine dependence: Secondary | ICD-10-CM | POA: Diagnosis not present

## 2016-06-22 DIAGNOSIS — I129 Hypertensive chronic kidney disease with stage 1 through stage 4 chronic kidney disease, or unspecified chronic kidney disease: Secondary | ICD-10-CM | POA: Diagnosis present

## 2016-06-22 DIAGNOSIS — I6521 Occlusion and stenosis of right carotid artery: Secondary | ICD-10-CM | POA: Diagnosis not present

## 2016-06-22 DIAGNOSIS — D649 Anemia, unspecified: Secondary | ICD-10-CM | POA: Diagnosis not present

## 2016-06-22 DIAGNOSIS — Z794 Long term (current) use of insulin: Secondary | ICD-10-CM

## 2016-06-22 DIAGNOSIS — I779 Disorder of arteries and arterioles, unspecified: Secondary | ICD-10-CM | POA: Diagnosis present

## 2016-06-22 DIAGNOSIS — I6523 Occlusion and stenosis of bilateral carotid arteries: Principal | ICD-10-CM | POA: Diagnosis present

## 2016-06-22 DIAGNOSIS — K219 Gastro-esophageal reflux disease without esophagitis: Secondary | ICD-10-CM | POA: Diagnosis present

## 2016-06-22 DIAGNOSIS — N183 Chronic kidney disease, stage 3 (moderate): Secondary | ICD-10-CM | POA: Diagnosis present

## 2016-06-22 DIAGNOSIS — G4730 Sleep apnea, unspecified: Secondary | ICD-10-CM | POA: Diagnosis present

## 2016-06-22 DIAGNOSIS — R011 Cardiac murmur, unspecified: Secondary | ICD-10-CM | POA: Diagnosis present

## 2016-06-22 DIAGNOSIS — I739 Peripheral vascular disease, unspecified: Secondary | ICD-10-CM

## 2016-06-22 HISTORY — PX: PATCH ANGIOPLASTY: SHX6230

## 2016-06-22 HISTORY — DX: Occlusion and stenosis of right carotid artery: I65.21

## 2016-06-22 HISTORY — DX: Chronic kidney disease, stage 3 unspecified: N18.30

## 2016-06-22 HISTORY — PX: ENDARTERECTOMY: SHX5162

## 2016-06-22 HISTORY — DX: Type 2 diabetes mellitus without complications: E11.9

## 2016-06-22 HISTORY — DX: Chronic kidney disease, stage 3 (moderate): N18.3

## 2016-06-22 HISTORY — PX: CAROTID ENDARTERECTOMY: SUR193

## 2016-06-22 HISTORY — DX: Pure hypercholesterolemia, unspecified: E78.00

## 2016-06-22 LAB — GLUCOSE, CAPILLARY
GLUCOSE-CAPILLARY: 154 mg/dL — AB (ref 65–99)
Glucose-Capillary: 158 mg/dL — ABNORMAL HIGH (ref 65–99)
Glucose-Capillary: 120 mg/dL — ABNORMAL HIGH (ref 65–99)
Glucose-Capillary: 120 mg/dL — ABNORMAL HIGH (ref 65–99)

## 2016-06-22 LAB — CBC
HEMATOCRIT: 45.4 % (ref 39.0–52.0)
Hemoglobin: 14.7 g/dL (ref 13.0–17.0)
MCH: 28.9 pg (ref 26.0–34.0)
MCHC: 32.4 g/dL (ref 30.0–36.0)
MCV: 89.2 fL (ref 78.0–100.0)
PLATELETS: 79 10*3/uL — AB (ref 150–400)
RBC: 5.09 MIL/uL (ref 4.22–5.81)
RDW: 13.3 % (ref 11.5–15.5)
WBC: 4.1 10*3/uL (ref 4.0–10.5)

## 2016-06-22 SURGERY — ENDARTERECTOMY, CAROTID
Anesthesia: General | Site: Neck | Laterality: Right

## 2016-06-22 MED ORDER — FISH OIL 1000 MG PO CAPS: 2000.0000 mg | ORAL_CAPSULE | Freq: Every day | ORAL | Status: DC

## 2016-06-22 MED ORDER — LIDOCAINE 2% (20 MG/ML) 5 ML SYRINGE
INTRAMUSCULAR | Status: DC | PRN
  Administered 2016-06-22: 100 mg via INTRAVENOUS

## 2016-06-22 MED ORDER — TESTOSTERONE CYPIONATE 200 MG/ML IM SOLN: 200.0000 mg | INTRAMUSCULAR | Status: DC

## 2016-06-22 MED ORDER — SODIUM CHLORIDE 0.9 % IV SOLN: 500.0000 mL | Freq: Once | INTRAVENOUS | Status: DC | PRN

## 2016-06-22 MED ORDER — PHENYLEPHRINE 40 MCG/ML (10ML) SYRINGE FOR IV PUSH (FOR BLOOD PRESSURE SUPPORT)
PREFILLED_SYRINGE | INTRAVENOUS | Status: DC | PRN
  Administered 2016-06-22: 40 ug via INTRAVENOUS

## 2016-06-22 MED ORDER — 0.9 % SODIUM CHLORIDE (POUR BTL) OPTIME
TOPICAL | Status: DC | PRN
  Administered 2016-06-22: 2000 mL

## 2016-06-22 MED ORDER — HEPARIN SODIUM (PORCINE) 1000 UNIT/ML IJ SOLN
INTRAMUSCULAR | Status: AC
  Filled 2016-06-22: qty 1

## 2016-06-22 MED ORDER — MAGNESIUM OXIDE 400 (241.3 MG) MG PO TABS: 400.0000 mg | ORAL_TABLET | Freq: Every morning | ORAL | Status: DC

## 2016-06-22 MED ORDER — BISACODYL 10 MG RE SUPP: 10.0000 mg | Freq: Every day | RECTAL | Status: DC | PRN

## 2016-06-22 MED ORDER — FENTANYL CITRATE (PF) 100 MCG/2ML IJ SOLN
25.0000 ug | INTRAMUSCULAR | Status: DC | PRN
  Administered 2016-06-22: 50 ug via INTRAVENOUS
  Administered 2016-06-22: 25 ug via INTRAVENOUS
  Administered 2016-06-22: 50 ug via INTRAVENOUS
  Administered 2016-06-22: 25 ug via INTRAVENOUS

## 2016-06-22 MED ORDER — LABETALOL HCL 5 MG/ML IV SOLN: 10.0000 mg | INTRAVENOUS | Status: DC | PRN

## 2016-06-22 MED ORDER — FENTANYL CITRATE (PF) 100 MCG/2ML IJ SOLN
INTRAMUSCULAR | Status: AC
  Administered 2016-06-22: 50 ug via INTRAVENOUS
  Filled 2016-06-22: qty 2

## 2016-06-22 MED ORDER — LACTATED RINGERS IV SOLN
INTRAVENOUS | Status: DC | PRN
  Administered 2016-06-22 (×2): via INTRAVENOUS

## 2016-06-22 MED ORDER — HEPARIN SODIUM (PORCINE) 1000 UNIT/ML IJ SOLN
INTRAMUSCULAR | Status: DC | PRN
  Administered 2016-06-22: 8 mL via INTRAVENOUS

## 2016-06-22 MED ORDER — ROSUVASTATIN CALCIUM 10 MG PO TABS: 10.0000 mg | ORAL_TABLET | Freq: Every morning | ORAL | Status: DC

## 2016-06-22 MED ORDER — CYANOCOBALAMIN 1000 MCG/ML IJ SOLN: 1000.0000 ug | INTRAMUSCULAR | Status: DC

## 2016-06-22 MED ORDER — PHENOL 1.4 % MT LIQD: 1.0000 | OROMUCOSAL | Status: DC | PRN

## 2016-06-22 MED ORDER — SUFENTANIL CITRATE 50 MCG/ML IV SOLN
INTRAVENOUS | Status: AC
  Filled 2016-06-22: qty 1

## 2016-06-22 MED ORDER — ADULT MULTIVITAMIN W/MINERALS CH: 1.0000 | ORAL_TABLET | Freq: Every morning | ORAL | Status: DC

## 2016-06-22 MED ORDER — ASPIRIN EC 81 MG PO TBEC
81.0000 mg | DELAYED_RELEASE_TABLET | Freq: Every day | ORAL | Status: DC
  Administered 2016-06-22: 81 mg via ORAL
  Filled 2016-06-22: qty 1

## 2016-06-22 MED ORDER — KCL IN DEXTROSE-NACL 20-5-0.45 MEQ/L-%-% IV SOLN
INTRAVENOUS | Status: DC
  Administered 2016-06-22: 1000 mL via INTRAVENOUS
  Filled 2016-06-22 (×2): qty 1000

## 2016-06-22 MED ORDER — SODIUM CHLORIDE 0.9 % IV SOLN: INTRAVENOUS | Status: DC

## 2016-06-22 MED ORDER — REMIFENTANIL HCL 1 MG IV SOLR
0.0125 ug/kg/min | INTRAVENOUS | Status: AC
  Administered 2016-06-22: .2 ug/kg/min via INTRAVENOUS
  Filled 2016-06-22: qty 2000

## 2016-06-22 MED ORDER — PSYLLIUM 95 % PO PACK
1.0000 | PACK | Freq: Every day | ORAL | Status: DC
  Filled 2016-06-22: qty 1

## 2016-06-22 MED ORDER — LIDOCAINE HCL (PF) 1 % IJ SOLN
INTRAMUSCULAR | Status: AC
  Filled 2016-06-22: qty 30

## 2016-06-22 MED ORDER — ROCURONIUM BROMIDE 10 MG/ML (PF) SYRINGE
PREFILLED_SYRINGE | INTRAVENOUS | Status: AC
  Filled 2016-06-22: qty 10

## 2016-06-22 MED ORDER — OMEGA-3-ACID ETHYL ESTERS 1 G PO CAPS: 2.0000 g | ORAL_CAPSULE | Freq: Every day | ORAL | Status: DC

## 2016-06-22 MED ORDER — ACETAMINOPHEN 325 MG PO TABS: 325.0000 mg | ORAL_TABLET | Freq: Four times a day (QID) | ORAL | Status: DC | PRN

## 2016-06-22 MED ORDER — CHLORHEXIDINE GLUCONATE CLOTH 2 % EX PADS: 6.0000 | MEDICATED_PAD | Freq: Once | CUTANEOUS | Status: DC

## 2016-06-22 MED ORDER — OXYCODONE HCL 5 MG PO TABS: 5.0000 mg | ORAL_TABLET | Freq: Four times a day (QID) | ORAL | 0 refills | Status: DC | PRN

## 2016-06-22 MED ORDER — MORPHINE SULFATE (PF) 2 MG/ML IV SOLN
INTRAVENOUS | Status: AC
  Filled 2016-06-22: qty 1

## 2016-06-22 MED ORDER — MAGNESIUM HYDROXIDE 400 MG/5ML PO SUSP: 30.0000 mL | Freq: Every day | ORAL | Status: DC | PRN

## 2016-06-22 MED ORDER — SODIUM CHLORIDE 0.9 % IJ SOLN
INTRAMUSCULAR | Status: AC
  Filled 2016-06-22: qty 10

## 2016-06-22 MED ORDER — GLUCOSAMINE-CHONDROITIN 500-400 MG PO TABS: 2.0000 | ORAL_TABLET | Freq: Every day | ORAL | Status: DC

## 2016-06-22 MED ORDER — SUGAMMADEX SODIUM 200 MG/2ML IV SOLN
INTRAVENOUS | Status: AC
  Filled 2016-06-22: qty 2

## 2016-06-22 MED ORDER — SUFENTANIL CITRATE 50 MCG/ML IV SOLN
INTRAVENOUS | Status: DC | PRN
Start: 2016-06-22 — End: 2016-06-22
  Administered 2016-06-22: 5 ug via INTRAVENOUS

## 2016-06-22 MED ORDER — PROTAMINE SULFATE 10 MG/ML IV SOLN
INTRAVENOUS | Status: DC | PRN
  Administered 2016-06-22: 50 mg via INTRAVENOUS

## 2016-06-22 MED ORDER — VANCOMYCIN HCL IN DEXTROSE 1-5 GM/200ML-% IV SOLN
1000.0000 mg | INTRAVENOUS | Status: AC
  Administered 2016-06-22: 1000 mg via INTRAVENOUS
  Filled 2016-06-22: qty 200

## 2016-06-22 MED ORDER — TRAZODONE HCL 100 MG PO TABS
100.0000 mg | ORAL_TABLET | Freq: Every day | ORAL | Status: DC
  Administered 2016-06-22: 100 mg via ORAL
  Filled 2016-06-22: qty 1

## 2016-06-22 MED ORDER — VANCOMYCIN HCL IN DEXTROSE 1-5 GM/200ML-% IV SOLN
1000.0000 mg | Freq: Two times a day (BID) | INTRAVENOUS | Status: AC
  Administered 2016-06-22 – 2016-06-23 (×2): 1000 mg via INTRAVENOUS
  Filled 2016-06-22 (×2): qty 200

## 2016-06-22 MED ORDER — PROPOFOL 10 MG/ML IV BOLUS
INTRAVENOUS | Status: AC
  Filled 2016-06-22: qty 20

## 2016-06-22 MED ORDER — SODIUM CHLORIDE 0.9 % IV SOLN
INTRAVENOUS | Status: DC | PRN
  Administered 2016-06-22: 500 mL

## 2016-06-22 MED ORDER — FENTANYL CITRATE (PF) 100 MCG/2ML IJ SOLN
INTRAMUSCULAR | Status: AC
  Filled 2016-06-22: qty 2

## 2016-06-22 MED ORDER — GLYCOPYRROLATE 0.2 MG/ML IJ SOLN
INTRAMUSCULAR | Status: DC | PRN
  Administered 2016-06-22: 0.2 mg via INTRAVENOUS

## 2016-06-22 MED ORDER — METOPROLOL TARTRATE 5 MG/5ML IV SOLN: 2.0000 mg | INTRAVENOUS | Status: DC | PRN

## 2016-06-22 MED ORDER — ALUM & MAG HYDROXIDE-SIMETH 200-200-20 MG/5ML PO SUSP: 15.0000 mL | ORAL | Status: DC | PRN

## 2016-06-22 MED ORDER — PROTAMINE SULFATE 10 MG/ML IV SOLN
INTRAVENOUS | Status: AC
  Filled 2016-06-22: qty 5

## 2016-06-22 MED ORDER — PHENYLEPHRINE HCL 10 MG/ML IJ SOLN
INTRAMUSCULAR | Status: AC
  Filled 2016-06-22: qty 1

## 2016-06-22 MED ORDER — SUGAMMADEX SODIUM 200 MG/2ML IV SOLN
INTRAVENOUS | Status: DC | PRN
  Administered 2016-06-22: 100 mg via INTRAVENOUS

## 2016-06-22 MED ORDER — METOPROLOL SUCCINATE ER 50 MG PO TB24: 50.0000 mg | ORAL_TABLET | Freq: Every morning | ORAL | Status: DC

## 2016-06-22 MED ORDER — OXYCODONE HCL 5 MG PO TABS
5.0000 mg | ORAL_TABLET | ORAL | Status: DC | PRN
  Administered 2016-06-22 – 2016-06-23 (×3): 5 mg via ORAL
  Filled 2016-06-22 (×3): qty 1

## 2016-06-22 MED ORDER — LORATADINE 10 MG PO TABS: 10.0000 mg | ORAL_TABLET | Freq: Every day | ORAL | Status: DC

## 2016-06-22 MED ORDER — PHENYLEPHRINE HCL 10 MG/ML IJ SOLN
INTRAVENOUS | Status: DC | PRN
  Administered 2016-06-22: 40 ug/min via INTRAVENOUS
  Administered 2016-06-22: 80 ug/min via INTRAVENOUS

## 2016-06-22 MED ORDER — MORPHINE SULFATE (PF) 2 MG/ML IV SOLN
2.0000 mg | INTRAVENOUS | Status: DC | PRN
  Administered 2016-06-22: 2 mg via INTRAVENOUS

## 2016-06-22 MED ORDER — ONDANSETRON HCL 4 MG/2ML IJ SOLN
INTRAMUSCULAR | Status: DC | PRN
  Administered 2016-06-22: 4 mg via INTRAVENOUS

## 2016-06-22 MED ORDER — EPHEDRINE SULFATE-NACL 50-0.9 MG/10ML-% IV SOSY
PREFILLED_SYRINGE | INTRAVENOUS | Status: DC | PRN
  Administered 2016-06-22: 10 mg via INTRAVENOUS

## 2016-06-22 MED ORDER — VITAMIN D 1000 UNITS PO TABS: 2000.0000 [IU] | ORAL_TABLET | Freq: Every morning | ORAL | Status: DC

## 2016-06-22 MED ORDER — PANTOPRAZOLE SODIUM 40 MG PO TBEC: 40.0000 mg | DELAYED_RELEASE_TABLET | Freq: Every day | ORAL | Status: DC

## 2016-06-22 MED ORDER — DOCUSATE SODIUM 100 MG PO CAPS: 100.0000 mg | ORAL_CAPSULE | Freq: Every day | ORAL | Status: DC

## 2016-06-22 MED ORDER — ROCURONIUM BROMIDE 10 MG/ML (PF) SYRINGE
PREFILLED_SYRINGE | INTRAVENOUS | Status: DC | PRN
  Administered 2016-06-22: 50 mg via INTRAVENOUS

## 2016-06-22 MED ORDER — HYDRALAZINE HCL 20 MG/ML IJ SOLN: 5.0000 mg | INTRAMUSCULAR | Status: DC | PRN

## 2016-06-22 MED ORDER — LIDOCAINE 2% (20 MG/ML) 5 ML SYRINGE
INTRAMUSCULAR | Status: AC
  Filled 2016-06-22: qty 5

## 2016-06-22 MED ORDER — FERROUS SULFATE 325 (65 FE) MG PO TABS: 325.0000 mg | ORAL_TABLET | Freq: Every morning | ORAL | Status: DC

## 2016-06-22 MED ORDER — FENOFIBRATE 160 MG PO TABS
160.0000 mg | ORAL_TABLET | Freq: Every day | ORAL | Status: DC
  Administered 2016-06-22: 160 mg via ORAL
  Filled 2016-06-22: qty 1

## 2016-06-22 MED ORDER — LISINOPRIL 20 MG PO TABS
20.0000 mg | ORAL_TABLET | Freq: Every day | ORAL | Status: DC
  Administered 2016-06-22: 20 mg via ORAL
  Filled 2016-06-22: qty 1

## 2016-06-22 MED ORDER — INSULIN ASPART 100 UNIT/ML ~~LOC~~ SOLN
0.0000 [IU] | Freq: Three times a day (TID) | SUBCUTANEOUS | Status: DC
  Administered 2016-06-23: 2 [IU] via SUBCUTANEOUS

## 2016-06-22 MED ORDER — POTASSIUM CHLORIDE CRYS ER 20 MEQ PO TBCR: 20.0000 meq | EXTENDED_RELEASE_TABLET | Freq: Every day | ORAL | Status: DC

## 2016-06-22 MED ORDER — PROPOFOL 10 MG/ML IV BOLUS
INTRAVENOUS | Status: DC | PRN
  Administered 2016-06-22: 150 mg via INTRAVENOUS

## 2016-06-22 MED ORDER — PROMETHAZINE HCL 25 MG/ML IJ SOLN: 6.2500 mg | INTRAMUSCULAR | Status: DC | PRN

## 2016-06-22 MED ORDER — SILDENAFIL CITRATE 20 MG PO TABS: 20.0000 mg | ORAL_TABLET | ORAL | Status: DC | PRN

## 2016-06-22 MED ORDER — CITALOPRAM HYDROBROMIDE 20 MG PO TABS
20.0000 mg | ORAL_TABLET | Freq: Every day | ORAL | Status: DC
  Administered 2016-06-22: 20 mg via ORAL
  Filled 2016-06-22: qty 1

## 2016-06-22 MED ORDER — POTASSIUM CHLORIDE CRYS ER 20 MEQ PO TBCR: 20.0000 meq | EXTENDED_RELEASE_TABLET | Freq: Once | ORAL | Status: DC | PRN

## 2016-06-22 MED ORDER — ONDANSETRON HCL 4 MG/2ML IJ SOLN
INTRAMUSCULAR | Status: AC
  Filled 2016-06-22: qty 2

## 2016-06-22 MED ORDER — ONDANSETRON HCL 4 MG/2ML IJ SOLN: 4.0000 mg | Freq: Four times a day (QID) | INTRAMUSCULAR | Status: DC | PRN

## 2016-06-22 SURGICAL SUPPLY — 51 items
APL SKNCLS STERI-STRIP NONHPOA (GAUZE/BANDAGES/DRESSINGS) ×2
BENZOIN TINCTURE PRP APPL 2/3 (GAUZE/BANDAGES/DRESSINGS) ×4 IMPLANT
CANISTER SUCTION 2500CC (MISCELLANEOUS) ×4 IMPLANT
CANNULA VESSEL 3MM 2 BLNT TIP (CANNULA) ×4 IMPLANT
CATH ROBINSON RED A/P 18FR (CATHETERS) ×4 IMPLANT
CLIP LIGATING EXTRA MED SLVR (CLIP) ×4 IMPLANT
CLIP LIGATING EXTRA SM BLUE (MISCELLANEOUS) ×4 IMPLANT
CLOSURE WOUND 1/2 X4 (GAUZE/BANDAGES/DRESSINGS) ×1
CRADLE DONUT ADULT HEAD (MISCELLANEOUS) ×4 IMPLANT
DECANTER SPIKE VIAL GLASS SM (MISCELLANEOUS) IMPLANT
DRAIN HEMOVAC 1/8 X 5 (WOUND CARE) IMPLANT
DRSG COVADERM 4X8 (GAUZE/BANDAGES/DRESSINGS) ×2 IMPLANT
ELECT REM PT RETURN 9FT ADLT (ELECTROSURGICAL) ×4
ELECTRODE REM PT RTRN 9FT ADLT (ELECTROSURGICAL) ×2 IMPLANT
EVACUATOR SILICONE 100CC (DRAIN) IMPLANT
GAUZE SPONGE 4X4 12PLY STRL (GAUZE/BANDAGES/DRESSINGS) ×4 IMPLANT
GEL ULTRASOUND 20GR AQUASONIC (MISCELLANEOUS) IMPLANT
GLOVE BIO SURGEON STRL SZ 6.5 (GLOVE) ×2 IMPLANT
GLOVE BIO SURGEONS STRL SZ 6.5 (GLOVE) ×2
GLOVE BIOGEL PI IND STRL 6 (GLOVE) IMPLANT
GLOVE BIOGEL PI IND STRL 6.5 (GLOVE) IMPLANT
GLOVE BIOGEL PI IND STRL 7.0 (GLOVE) IMPLANT
GLOVE BIOGEL PI INDICATOR 6 (GLOVE) ×2
GLOVE BIOGEL PI INDICATOR 6.5 (GLOVE) ×2
GLOVE BIOGEL PI INDICATOR 7.0 (GLOVE) ×2
GLOVE SS BIOGEL STRL SZ 6.5 (GLOVE) IMPLANT
GLOVE SS BIOGEL STRL SZ 7.5 (GLOVE) ×2 IMPLANT
GLOVE SUPERSENSE BIOGEL SZ 6.5 (GLOVE) ×2
GLOVE SUPERSENSE BIOGEL SZ 7.5 (GLOVE) ×2
GOWN STRL REUS W/ TWL LRG LVL3 (GOWN DISPOSABLE) ×6 IMPLANT
GOWN STRL REUS W/TWL LRG LVL3 (GOWN DISPOSABLE) ×12
KIT BASIN OR (CUSTOM PROCEDURE TRAY) ×4 IMPLANT
KIT ROOM TURNOVER OR (KITS) ×4 IMPLANT
NEEDLE 22X1 1/2 (OR ONLY) (NEEDLE) IMPLANT
NS IRRIG 1000ML POUR BTL (IV SOLUTION) ×8 IMPLANT
PACK CAROTID (CUSTOM PROCEDURE TRAY) ×4 IMPLANT
PAD ARMBOARD 7.5X6 YLW CONV (MISCELLANEOUS) ×8 IMPLANT
PATCH HEMASHIELD 8X75 (Vascular Products) ×2 IMPLANT
SHUNT CAROTID BYPASS 10 (VASCULAR PRODUCTS) ×2 IMPLANT
SHUNT CAROTID BYPASS 12FRX15.5 (VASCULAR PRODUCTS) IMPLANT
SPONGE INTESTINAL PEANUT (DISPOSABLE) ×4 IMPLANT
STRIP CLOSURE SKIN 1/2X4 (GAUZE/BANDAGES/DRESSINGS) ×3 IMPLANT
SUT ETHILON 3 0 PS 1 (SUTURE) IMPLANT
SUT PROLENE 6 0 CC (SUTURE) ×8 IMPLANT
SUT SILK 3 0 (SUTURE)
SUT SILK 3-0 18XBRD TIE 12 (SUTURE) IMPLANT
SUT VIC AB 3-0 SH 27 (SUTURE) ×8
SUT VIC AB 3-0 SH 27X BRD (SUTURE) ×4 IMPLANT
SUT VICRYL 4-0 PS2 18IN ABS (SUTURE) ×4 IMPLANT
SYR CONTROL 10ML LL (SYRINGE) IMPLANT
WATER STERILE IRR 1000ML POUR (IV SOLUTION) ×4 IMPLANT

## 2016-06-22 NOTE — H&P (View-Only) (Signed)
Chief Complaint: Follow up Extracranial Carotid Artery Stenosis   History of Present Illness  Javier Potter is a 70 y.o. male patient of Dr. Donnetta Hutching whom he saw in November 2016 on referral. Patient was found to have soft bilateral carotid bruits and also reported bilateral pulsatile tinnitus, but he denies ever having this. He specifically denied any prior history of amaurosis fugax, transient ischemic attack, aphasia or stroke. He has no significant history of cardiac disease. He does have a history of heart murmur.  He returns today for discussion of results of the CTA of his neck performed this morning. He saw a nephrologist yesterday with [facility], states he had a renal ultrasound today.   He started seeing "floaters" in his right eye in July 2017, and has been followed by his eye doctor. 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 Stated he was strongly encouraged to f/u with the Vascular surgeon, to check on his Carotid Disease. Pt. denied any temporary loss of vision in eyes, denied unilateral weakness of extremities, denied difficulty with speech, denied unilateral facial droop, denied balance issues, or disorientation. His ophthalmologist is Dr. Johnn Hai, [facility].  Review of records from Dr. 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 He will make the appointment to be re-evaluated as soon as possible. He is to go straight to the Emergency Room if any s/s's of stroke occurs.  He is walking an hour daily, and exercises in a gym 3 days/week. He denies claudication symptoms with walking.   Pt Diabetic: yes, states his A1C in August 2017 was 6.4 Pt smoker: former smoker, quit in 2000, was up to 3 ppd, smoked for about 40 years  Pt meds include:  Statin : yes ASA:  yes Other anticoagulants/antiplatelets: no   Past Medical History:  Diagnosis Date  . Anemia   . Chronic kidney disease   . Diabetes mellitus    type 2  . GERD (gastroesophageal reflux disease)    under control  . Hypertension   . Murmur    had since childhood-no problems  . Sleep apnea    uses No CPAP    Social History Social History  Substance Use Topics  . Smoking status: Former Smoker    Packs/day: 3.00    Years: 40.00    Types: Cigarettes    Quit date: 06/18/1999  . Smokeless tobacco: Never Used  . Alcohol use Yes     Comment: rarely    Family History No family history on file.  Surgical History Past Surgical History:  Procedure Laterality Date  . CHOLECYSTECTOMY N/A 11/14/2014   Procedure: LAPAROSCOPIC CHOLECYSTECTOMY WITH INTRAOPERATIVE CHOLANGIOGRAM;  Surgeon: Johnathan Hausen, MD;  Location: [facility] ORS;  Service: General;  Laterality: N/A;  . COLONOSCOPY WITH PROPOFOL N/A 07/28/2014   Procedure: COLONOSCOPY WITH PROPOFOL;  Surgeon: Garlan Fair, MD;  Location: [facility] ENDOSCOPY;  Service: Endoscopy;  Laterality: N/A;  . ESOPHAGOGASTRODUODENOSCOPY (EGD) WITH PROPOFOL N/A 07/28/2014   Procedure: ESOPHAGOGASTRODUODENOSCOPY (EGD) WITH PROPOFOL;  Surgeon: Garlan Fair, MD;  Location: [facility] ENDOSCOPY;  Service: Endoscopy;  Laterality: N/A;  . NASAL SINUS SURGERY    . TONSILLECTOMY  age 86  . WISDOM TOOTH EXTRACTION  as teen    Allergies  Allergen Reactions  . Ambien [Zolpidem Tartrate]     "did not sit well  with system..made me go crazy"  . Augmentin [Amoxicillin-Pot Clavulanate] Swelling    Tongue swells Patient states has tolerated amoxicillin  . Sulfa Antibiotics Swelling    Tongue swells    Current Outpatient Prescriptions  Medication Sig Dispense Refill  . Alpha-Lipoic Acid 600 MG CAPS Take 1 capsule by mouth every morning.     Marland Kitchen aspirin EC 81 MG tablet Take 81 mg by mouth at bedtime.    . cetirizine (ZYRTEC) 10 MG chewable tablet Chew 10 mg by mouth  daily.    . Cholecalciferol (VITAMIN D) 2000 UNITS CAPS Take 1 capsule by mouth every morning.    . citalopram (CELEXA) 40 MG tablet Take 20 mg by mouth daily.     . cyanocobalamin (,VITAMIN B-12,) 1000 MCG/ML injection Inject 1,000 mcg into the muscle every 30 (thirty) days.    . diphenhydrAMINE (SOMINEX) 25 MG tablet Take 25 mg by mouth at bedtime as needed for allergies.     . fenofibrate 160 MG tablet Take 160 mg by mouth at bedtime.     . Ferrous Sulfate (IRON) 325 (65 FE) MG TABS Take 1 tablet by mouth every morning.    . insulin aspart (NOVOLOG) 100 UNIT/ML injection Inject 28 Units into the skin 2 (two) times daily.     . insulin glargine (LANTUS) 100 UNIT/ML injection Inject 30 Units into the skin 2 (two) times daily.     Marland Kitchen lisinopril (PRINIVIL,ZESTRIL) 20 MG tablet Take 20 mg by mouth at bedtime.    . Magnesium 250 MG TABS Take 400 mg by mouth every morning.     . metoprolol succinate (TOPROL-XL) 50 MG 24 hr tablet Take 50 mg by mouth every morning. Take with or immediately following a meal.    . Multiple Vitamin (MULTIVITAMIN WITH MINERALS) TABS tablet Take 1 tablet by mouth every morning.    . Omega-3 Fatty Acids (FISH OIL) 1200 MG CAPS Take 2 capsules by mouth 2 (two) times daily.     Marland Kitchen omeprazole (PRILOSEC) 20 MG capsule Take 20 mg by mouth 2 (two) times daily.    Marland Kitchen oxyCODONE (OXY IR/ROXICODONE) 5 MG immediate release tablet Take 1 tablet (5 mg total) by mouth every 3 (three) hours as needed for moderate pain or severe pain. 30 tablet 0  . pioglitazone (ACTOS) 30 MG tablet Take 30 mg by mouth every morning.    . potassium chloride SA (K-DUR,KLOR-CON) 20 MEQ tablet Take 20 mEq by mouth daily.     . psyllium (METAMUCIL) 58.6 % powder Take 1 packet by mouth at bedtime.    . rosuvastatin (CRESTOR) 10 MG tablet Take 10 mg by mouth every morning.    . testosterone cypionate (DEPOTESTOTERONE CYPIONATE) 200 MG/ML injection Inject 200 mg into the muscle every 28 (twenty-eight) days.    .  traZODone (DESYREL) 50 MG tablet Take 50 mg by mouth at bedtime.     No current facility-administered medications for this visit.     Review of Systems : See HPI for pertinent positives and negatives.  Physical Examination  Vitals:   05/24/16 1336 05/24/16 1338  BP: 123/66 139/67  Pulse: 66   Resp: 16   Temp: 98.2 F (36.8 C)   TempSrc: Oral   SpO2: 93%   Weight: 188 lb (85.3 kg)   Height: 5\' 5"  (1.651 m)    Body mass index is 31.28 kg/m.  General: WDWN obese male in NAD GAIT: normal Eyes: PERRLA Pulmonary:  Respirations are non-labored, CTAB, good air movement  in all fields. Cardiac: regular rhythm and rate,  no detected murmur.  VASCULAR EXAM Carotid Bruits Right Left   positive Positive    Aorta is not palpable. Radial pulses are 2+ palpable and equal.                                                                                                                                          LE Pulses Right Left       POPLITEAL  not palpable  not palpable       POSTERIOR TIBIAL   palpable   palpable       DORSALIS PEDIS      ANTERIOR TIBIAL  palpable  palpable    Gastrointestinal: soft, nontender, BS WNL, no r/g, no palpable masses.  Musculoskeletal: no muscle atrophy/wasting. M/S 5/5 throughout, extremities without ischemic changes.  Neurologic: A&O X 3; Appropriate Affect, Speech is normal CN 2-12 intact, pain and light touch intact in extremities, Motor exam as listed above.    Assessment: Javier Potter is a 70 y.o. male who has no history of stroke or ITA. He has bilateral carotid bruits. He has no history of stroke or TIA. He reports floaters in his right eye in July 2017.  His opthalmologist found possible Hollenhorst plaque in his right eye on exam on 04/21/16.  11/15/14 serum creatinine result on file was 1.54.  He is under evaluation by [facility], states he was told that he has Stage 3 CKD.   Dr. Donnetta Hutching spoke with and examined pt. Dr.  Donnetta Hutching also reviewed pt's echocardiogram results from 06/06/14.  Pt's atherosclerotic risk factors include former 3 ppd smoker (smoked for 40 years, quit in 2003), DM with an A1C of 6.4, and obesity. Fortunately he has no known CAD.    DATA 04/27/16 carotid duplex suggests 40-59% right ICA stenosis and <40% left ICA stenosis. No significant change from duplex of 07/28/15.   Dr. Donnetta Hutching reviewed CTA neck images from CTA performed earlier today.  Dr. Donnetta Hutching estimates right ICA stenosis at about 70%.  Radiologist report available online later today, CTA neck (05/24/16): 60% diameter stenosis proximal right internal carotid artery. 25% diameter stenosis proximal left internal carotid artery. Severe stenosis at the origin of the left external carotid artery Mild atherosclerotic disease in the vertebral artery bilaterally. Left vertebral artery does not contribute to the basilar and ends in PICA.    Plan: Pt will be scheduled for right CEA by Dr. Donnetta Hutching.  I discussed in depth with the patient the nature of atherosclerosis, and emphasized the importance of maximal medical management including strict control of blood pressure, blood glucose, and lipid levels, obtaining regular exercise, and continued cessation of smoking.  The patient is aware that without maximal medical management the underlying atherosclerotic disease process will progress, limiting the benefit of any interventions. The patient was given information about stroke prevention and what  symptoms should prompt the patient to seek immediate medical care. Thank you for allowing [place] to participate in this patient's care.  Clemon Chambers, RN, MSN, FNP-C [facility] Office: [phone]  Clinic Physician: Early  05/24/16 2:16 PM

## 2016-06-22 NOTE — Transfer of Care (Signed)
Immediate Anesthesia Transfer of Care Note  Patient: Javier Potter  Procedure(s) Performed: Procedure(s): Right Carotid ENDARTERECTOMY (Right) PATCH ANGIOPLASTY  Patient Location: PACU  Anesthesia Type:General  Level of Consciousness: awake, oriented and patient cooperative  Airway & Oxygen Therapy: Patient Spontanous Breathing and Patient connected to nasal cannula oxygen  Post-op Assessment: Report given to RN, Post -op Vital signs reviewed and stable, Patient moving all extremities and Patient able to stick tongue midline  Post vital signs: Reviewed and stable  Last Vitals:  Vitals:   06/22/16 0638 06/22/16 1043  BP: (!) 189/59   Pulse: 61   Resp: 20   Temp: 36.8 C (P) 36.8 C    Last Pain:  Vitals:   06/22/16 0638  TempSrc: Oral      Patients Stated Pain Goal: 5 (01/65/53 7482)  Complications: No apparent anesthesia complications

## 2016-06-22 NOTE — Op Note (Signed)
    OPERATIVE REPORT  DATE OF SURGERY: 06/22/2016  PATIENT: Javier Potter, 70 y.o. male MRN: [id number]  DOB: 1945/09/27  PRE-OPERATIVE DIAGNOSIS: Symptomatic right internal carotid artery stenosis  POST-OPERATIVE DIAGNOSIS:  Same  PROCEDURE: Right carotid endarterectomy and Dacron patch angioplasty  SURGEON:  Curt Jews, M.D.  PHYSICIAN ASSISTANT: Samantha Rhyne PA-C  ANESTHESIA:  Gen.  EBL: Minimal ml  Total I/O In: 1000 [I.V.:1000] Out: 75 [Blood:75]  BLOOD ADMINISTERED: None  DRAINS: None  SPECIMEN: None  COUNTS CORRECT:  YES  PLAN OF CARE: PACU hemodynamically stable and neurologically intact   PATIENT DISPOSITION:  PACU - hemodynamically stable  PROCEDURE DETAILS: The patient was taken to the operative placed supine position where the area the right  Draped in sterile fashion. Incision made anterior sternocleidal mastoid and carried down through the platysma left cautery. The cervical side effects posteriorly and the carotid sheath was opened. The facial vein was ligated with 2-0 silk ties and divided. The vagus and hypoglossal nerves were identified and preserved. The common carotid artery was encircled with an umbilical tape and Rummel tourniquet. The internal carotid artery was encircled with a umbilical tape and Rummel tourniquet. The external carotid was encircled with a blue vessel loop and the hypoglossal nerve was controlled with a 2-0 silk Potts tie. The patient was given 9000 units intravenous heparin. After adequate circulation time the internal/external and common carotid arteries were occluded. The common carotid artery was opened with an 11 blade some ulcerative with Potts scissors through the plaque onto the internal carotid artery. A 10 shunt was passed up the internal carotid and allowed to back bleed and then placed down the common carotid was secured with Rummel tourniquet. The endarterectomy was again on the common carotid artery and the plaque was  divided proximally with Potts scissors. The endarterectomy onto the bifurcation and the external carotid was endarterectomized with eversion technique and the internal carotid was endarterectomized in an open fashion. Remaining atheromatous debris was removed from the endarterectomy plane. Did appear that there've been a bleed into a very irregular patch which was quite focal at the origin of the internal carotid artery. Mass Hemashield Dacron patch was brought onto the field and was sewn as a patch angioplasty with a running 6-0 Prolene suture. Prior to completion of the closure the shunt was removed and the usual flushing maneuvers were undertaken. Anastomosis completed and flow was first to the external and then the internal carotid artery. Excellent flow characteristics were noted with hand-held Doppler in the internal and external carotid arteries. The patient was given 50 mg of protamine to reverse the heparin. Wounds irrigated with saline. Hemostasis tablet cautery. Wounds were closed with several 3-0 Vicryl sutures to reapproximate sternocleidomastoid and the carotid sheath. Next the platysma was closed running 3-0 Vicryl suture. Finally the skin was closed with a 4-0 subcuticular Vicryl suture. The patient was awakened neurologically intact in the operating room and transferred to the recovery room in stable condition   Rosetta Posner, M.D., [facility] 06/22/2016 4:41 PM

## 2016-06-22 NOTE — Anesthesia Procedure Notes (Signed)
Procedure Name: Intubation Date/Time: 06/22/2016 8:45 AM Performed by: Melina Copa, Zelma Snead R Pre-anesthesia Checklist: Patient identified, Emergency Drugs available, Suction available and Patient being monitored Patient Re-evaluated:Patient Re-evaluated prior to inductionOxygen Delivery Method: Circle System Utilized Preoxygenation: Pre-oxygenation with 100% oxygen Intubation Type: IV induction Ventilation: Mask ventilation without difficulty Laryngoscope Size: Mac and 3 Grade View: Grade II Tube type: Oral Tube size: 7.5 mm Number of attempts: 1 Airway Equipment and Method: Stylet Placement Confirmation: ETT inserted through vocal cords under direct vision,  positive ETCO2 and breath sounds checked- equal and bilateral Secured at: 23 cm Tube secured with: Tape Dental Injury: Teeth and Oropharynx as per pre-operative assessment

## 2016-06-22 NOTE — Anesthesia Postprocedure Evaluation (Signed)
Anesthesia Post Note  Patient: LINDA BIEHN  Procedure(s) Performed: Procedure(s) (LRB): Right Carotid ENDARTERECTOMY (Right) PATCH ANGIOPLASTY  Patient location during evaluation: PACU Anesthesia Type: General Level of consciousness: awake and alert Pain management: pain level controlled Vital Signs Assessment: post-procedure vital signs reviewed and stable Respiratory status: spontaneous breathing, nonlabored ventilation, respiratory function stable and patient connected to nasal cannula oxygen Cardiovascular status: blood pressure returned to baseline and stable Postop Assessment: no signs of nausea or vomiting Anesthetic complications: no    Last Vitals:  Vitals:   06/22/16 1300 06/22/16 1330  BP: (!) 107/50   Pulse: (!) 53 (!) 58  Resp: 13 13  Temp:      Last Pain:  Vitals:   06/22/16 1251  TempSrc:   PainSc: Tyler Deis

## 2016-06-22 NOTE — Progress Notes (Signed)
  Day of Surgery Note    Subjective:  No complaints  Vitals:   06/22/16 1300 06/22/16 1330  BP: (!) 107/50   Pulse: (!) 53 (!) 58  Resp: 13 13  Temp:      Incisions:   Bandage is clean and dry Extremities:  Moving all extremities equally Cardiac:  regular Lungs:  Non labored Neuro:  In tact; tongue is midline  Assessment/Plan:  This is a 70 y.o. male who is s/p right carotid endarterectomy  -pt doing well in pacu -to [place] when bed available -anticipate dc tomorrow if no issues   Leontine Locket, PA-C 06/22/2016 2:48 PM [phone]

## 2016-06-22 NOTE — Interval H&P Note (Signed)
History and Physical Interval Note:  06/22/2016 8:04 AM  Javier Potter  has presented today for surgery, with the diagnosis of Right carotid artery stenosis I65.21  The various methods of treatment have been discussed with the patient and family. After consideration of risks, benefits and other options for treatment, the patient has consented to  Procedure(s): ENDARTERECTOMY CAROTID (Right) as a surgical intervention .  The patient's history has been reviewed, patient examined, no change in status, stable for surgery.  I have reviewed the patient's chart and labs.  Questions were answered to the patient's satisfaction.     Curt Jews

## 2016-06-23 ENCOUNTER — Encounter (HOSPITAL_COMMUNITY): Payer: Self-pay | Admitting: Vascular Surgery

## 2016-06-23 LAB — BASIC METABOLIC PANEL
Anion gap: 8 (ref 5–15)
BUN: 30 mg/dL — AB (ref 6–20)
CHLORIDE: 104 mmol/L (ref 101–111)
CO2: 27 mmol/L (ref 22–32)
Calcium: 9 mg/dL (ref 8.9–10.3)
Creatinine, Ser: 1.49 mg/dL — ABNORMAL HIGH (ref 0.61–1.24)
GFR calc Af Amer: 53 mL/min — ABNORMAL LOW (ref >60)
GFR, EST NON AFRICAN AMERICAN: 46 mL/min — AB (ref >60)
GLUCOSE: 120 mg/dL — AB (ref 65–99)
Potassium: 4.3 mmol/L (ref 3.5–5.1)
Sodium: 139 mmol/L (ref 135–145)

## 2016-06-23 LAB — GLUCOSE, CAPILLARY: Glucose-Capillary: 140 mg/dL — ABNORMAL HIGH (ref 65–99)

## 2016-06-23 LAB — CBC
HCT: 41.5 % (ref 39.0–52.0)
Hemoglobin: 13.2 g/dL (ref 13.0–17.0)
MCH: 28.8 pg (ref 26.0–34.0)
MCHC: 31.8 g/dL (ref 30.0–36.0)
MCV: 90.6 fL (ref 78.0–100.0)
PLATELETS: 79 10*3/uL — AB (ref 150–400)
RBC: 4.58 MIL/uL (ref 4.22–5.81)
RDW: 13.4 % (ref 11.5–15.5)
WBC: 5.4 10*3/uL (ref 4.0–10.5)

## 2016-06-23 MED ORDER — OXYCODONE HCL 5 MG PO TABS: 5.0000 mg | ORAL_TABLET | ORAL | 0 refills | Status: DC | PRN

## 2016-06-23 NOTE — Progress Notes (Signed)
Subjective: Interval History: none.. Looks great this morning. Slept well. Mild discomfort.  Objective: Vital signs in last 24 hours: Temp:  [97.2 F (36.2 C)-98.3 F (36.8 C)] 98 F (36.7 C) (10/26 0409) Pulse Rate:  [50-105] 64 (10/26 0410) Resp:  [10-19] 17 (10/26 0410) BP: (107-144)/(40-88) 140/50 (10/26 0410) SpO2:  [91 %-99 %] 91 % (10/26 0410) Arterial Line BP: (96-162)/(36-55) 162/55 (10/26 0410) Weight:  [188 lb 7.9 oz (85.5 kg)] 188 lb 7.9 oz (85.5 kg) (10/25 1542)  Intake/Output from previous day: 10/25 0701 - 10/26 0700 In: 1240 [P.O.:240; I.V.:1000] Out: 600 [Urine:525; Blood:75] Intake/Output this shift: No intake/output data recorded.  Dressing removed. Neurologically intact with no hematoma.  Lab Results:  Recent Labs  06/22/16 0655 06/23/16 0430  WBC 4.1 5.4  HGB 14.7 13.2  HCT 45.4 41.5  PLT 79* 79*   BMET  Recent Labs  06/23/16 0430  NA 139  K 4.3  CL 104  CO2 27  GLUCOSE 120*  BUN 30*  CREATININE 1.49*  CALCIUM 9.0    Studies/Results: Ct Angio Neck W Or Wo Contrast  Result Date: 05/24/2016 CLINICAL DATA:  Right eye Hallenhorst  plaque EXAM: CT ANGIOGRAPHY NECK TECHNIQUE: Multidetector CT imaging of the neck was performed using the standard protocol during bolus administration of intravenous contrast. Multiplanar CT image reconstructions and MIPs were obtained to evaluate the vascular anatomy. Carotid stenosis measurements (when applicable) are obtained utilizing NASCET criteria, using the distal internal carotid diameter as the denominator. CONTRAST:  60 mL Isovue 370 IV COMPARISON:  Carotid ultrasound 06/11/2015 FINDINGS: Aortic arch: Mild atherosclerotic calcification aortic arch and proximal great vessels. Calcific plaque at the origin of the left common carotid artery without significant stenosis. Mild stenosis due to calcific plaque proximal left subclavian artery. Right carotid system: Right common carotid artery widely patent. Calcified  and noncalcified plaque at the right carotid bifurcation. 60% diameter stenosis proximal right internal carotid artery. Right external carotid artery widely patent. Atherosclerotic calcification in the right cavernous carotid without significant stenosis. Left carotid system: Left common carotid artery widely patent. Moderate calcified plaque at the carotid bifurcation. 25% diameter stenosis proximal left internal carotid artery. Severe stenosis at the origin of the external carotid artery. Atherosclerotic calcification left cavernous carotid without significant stenosis. Vertebral arteries:Right vertebral dominant. 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 No fracture or skeletal mass lesion. Other neck: Negative for mass or adenopathy in the neck. Upper chest: Negative IMPRESSION: 60% diameter stenosis proximal right internal carotid artery. 25% diameter stenosis proximal left internal carotid artery. Severe stenosis at the origin of the left external carotid artery Mild atherosclerotic disease in the vertebral artery bilaterally. Left vertebral artery does not contribute to the basilar and ends in PICA. Electronically Signed   By: Franchot Gallo M.D.   On: 05/24/2016 14:42   US Renal  Result Date: 05/24/2016 CLINICAL DATA:  Chronic kidney disease stage 3. EXAM: RENAL / URINARY TRACT ULTRASOUND COMPLETE COMPARISON:  None. FINDINGS: Right Kidney: Length: 11.9 cm. Echogenicity within normal limits. No mass or hydronephrosis visualized. Left Kidney: Length: 12.7 cm. Echogenicity within normal limits. No mass or hydronephrosis visualized. Bladder: Appears normal for degree of bladder distention. Bilateral ureteral jets are noted. IMPRESSION: Normal renal ultrasound. Electronically Signed   By: Marijo Conception, M.D.   On: 05/24/2016 12:56    Anti-infectives: Anti-infectives    Start  Dose/Rate Route Frequency Ordered Stop   06/22/16 1930  vancomycin (VANCOCIN) IVPB 1000 mg/200 mL premix     1,000 mg 200 mL/hr over 60 Minutes Intravenous Every 12 hours 06/22/16 1557 06/23/16 1929   06/22/16 0531  vancomycin (VANCOCIN) IVPB 1000 mg/200 mL premix     1,000 mg 200 mL/hr over 60 Minutes Intravenous 60 min pre-op 06/22/16 0531 06/22/16 0835      Assessment/Plan: s/p Procedure(s): Right Carotid ENDARTERECTOMY (Right) PATCH ANGIOPLASTY Stable overall. Has voided. Will discharge after breakfast and follow-up in 2 weeks   LOS: 1 day   Brandun Pinn 06/23/2016, 7:12 AM

## 2016-06-23 NOTE — Care Management Note (Signed)
Case Management Note  Patient Details  Name: Javier Potter MRN: [id number] Date of Birth: [birth year]  Subjective/Objective:     S/p r CEA , for dc today, no needs.               Action/Plan:   Expected Discharge Date:                  Expected Discharge Plan:  Home/Self Care  In-House Referral:     Discharge planning Services  CM Consult  Post Acute Care Choice:    Choice offered to:     DME Arranged:    DME Agency:     HH Arranged:    HH Agency:     Status of Service:  Completed, signed off  If discussed at [employer] of Stay Meetings, dates discussed:    Additional Comments:  Zenon Mayo, RN 06/23/2016, 10:11 AM

## 2016-06-24 ENCOUNTER — Telehealth: Payer: Self-pay | Admitting: Vascular Surgery

## 2016-06-24 NOTE — Telephone Encounter (Signed)
Spoke to pts spouse for appt on 11/15

## 2016-06-24 NOTE — Telephone Encounter (Signed)
-----   Message from Mena Goes, RN sent at 06/22/2016 10:45 AM EDT ----- Regarding: schedule   ----- Message ----- From: Gabriel Earing, PA-C Sent: 06/22/2016  10:27 AM To: [facility] Charge Pool  S/p right CEA 06/22/16.  F/u with Dr. Donnetta Hutching in 2-3 weeks.  Thanks, Aldona Bar

## 2016-07-01 NOTE — Discharge Summary (Signed)
[facility] Discharge Summary   Patient ID:  Javier Potter MRN: [id number] DOB/AGE: 1946-06-24 70 y.o.  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 Anemia    hx  . Arthritis    "hands" (06/22/2016)  . Carotid stenosis, right   . Chronic kidney disease (CKD), stage III (moderate)   . GERD (gastroesophageal reflux disease)    under control  . High cholesterol   . Hypertension   . Murmur    had since childhood-no problems  . Sleep apnea    "can't tolerate mask" (06/22/2016)  . Type II diabetes mellitus ([place])     Procedure(s): Right Carotid ENDARTERECTOMY PATCH ANGIOPLASTY  Discharged Condition: good  HPI: BRICEN Potter is a 70 y.o. male patient of Dr. Donnetta Hutching whom he saw in November 2016 on referral. Patient was found to have soft bilateral carotid bruits and also reported bilateral pulsatile tinnitus, but he denies ever having this. He specifically deniedany prior history of amaurosis fugax, transient ischemic attack, aphasia or stroke. He has no significant history of cardiac disease. He does have a history of heart murmur.  He returns today for discussion of results of the CTA of his neck performed this morning. He saw a nephrologist yesterday with [facility], states he had a renal ultrasound today.   He started seeing "floaters" in his right eye in July 2017, and has been followed by his eye doctor. 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 Stated he was strongly encouraged to f/u with the Vascular surgeon, to check on his Carotid Disease. Pt. denied any temporaryloss of vision in eyes, denied  unilateral weakness of extremities, denied difficulty with speech, denied unilateral facial droop, denied balance issues, or disorientation. His ophthalmologist is Dr. Johnn Hai, [facility].  Review of records from Dr. 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 He will make the appointment to be re-evaluated as soon as possible. He is to go straight to the Emergency Room if any s/s's of stroke occurs.  He is walking an hour daily, and exercises in a gym 3 days/week. He denies claudication symptoms with walking.   Pt Diabetic: yes, states his A1C in August 2017 was 6.4 Pt smoker: former smoker, quit in 2000, was up to 3 ppd, smoked for about 40 years  Pt meds include:  Statin : yes ASA: yes Other anticoagulants/antiplatelets: no  Dr. Donnetta Hutching reviewed CTA neck images from CTA performed earlier today.  Dr. Donnetta Hutching estimates right ICA stenosis at about 70%  Hospital Course:  Javier Potter is a 70 y.o. male is S/P  Procedure(s): Right Carotid ENDARTERECTOMY PATCH ANGIOPLASTY  Assessment/Plan: s/p Procedure(s): Right Carotid ENDARTERECTOMY (Right) PATCH ANGIOPLASTY Stable overall. Has voided. Will discharge after breakfast and follow-up in 2 weeks    Significant Diagnostic Studies: CBC Lab Results  Component Value Date   WBC 5.4 06/23/2016   HGB 13.2 06/23/2016   HCT 41.5 06/23/2016   MCV 90.6 06/23/2016   PLT 79 (L) 06/23/2016    BMET    Component Value Date/Time   NA 139 06/23/2016 0430   K 4.3 06/23/2016 0430  CL 104 06/23/2016 0430   CO2 27 06/23/2016 0430   GLUCOSE 120 (H) 06/23/2016 0430   BUN 30 (H) 06/23/2016 0430   CREATININE 1.49 (H) 06/23/2016 0430   CALCIUM 9.0 06/23/2016 0430   GFRNONAA 46 (L) 06/23/2016 0430   GFRAA 53 (L) 06/23/2016 0430   COAG Lab Results  Component Value Date   INR 1.13 06/15/2016   INR 1.17 11/15/2014      Disposition:  Discharge to :Home Discharge Instructions    CAROTID Sugery: Call MD for difficulty swallowing or speaking; weakness in arms or legs that is a new symtom; severe headache.  If you have increased swelling in the neck and/or  are having difficulty breathing, CALL 911    Complete by:  As directed    Call MD for:  redness, tenderness, or signs of infection (pain, swelling, bleeding, redness, odor or green/yellow discharge around incision site)    Complete by:  As directed    Call MD for:  severe or increased pain, loss or decreased feeling  in affected limb(s)    Complete by:  As directed    Call MD for:  temperature >100.5    Complete by:  As directed    Discharge patient    Complete by:  As directed    Discharge pt to home as long as he tolerates breakfast.   Discharge wound care:    Complete by:  As directed    Shower daily with soap and water starting 06/24/16.   Driving Restrictions    Complete by:  As directed    No driving for 2 weeks   Lifting restrictions    Complete by:  As directed    No lifting for 2 weeks   Resume previous diet    Complete by:  As directed        Medication List    TAKE these medications   acetaminophen 325 MG tablet Commonly known as:  TYLENOL Take 325 mg by mouth every 6 (six) hours as needed for moderate pain or headache.   ALLEGRA PO Take 1 tablet by mouth daily.   aspirin EC 81 MG tablet Take 81 mg by mouth at bedtime.   cetirizine 10 MG chewable tablet Commonly known as:  ZYRTEC Chew 10 mg by mouth daily.   citalopram 20 MG tablet Commonly known as:  CELEXA Take 20 mg by mouth daily.   cyanocobalamin 1000 MCG/ML injection Commonly known as:  (VITAMIN B-12) Inject 1,000 mcg into the muscle every 30 (thirty) days.   fenofibrate 160 MG tablet Take 160 mg by mouth at bedtime.   Fish Oil 1000 MG Caps Take 2,000 mg by mouth daily.   glucosamine-chondroitin 500-400 MG tablet Take 2 tablets by mouth daily.    insulin aspart 100 UNIT/ML injection Commonly known as:  novoLOG Inject 0-40 Units into the skin 3 (three) times daily with meals. Per sliding scale   insulin glargine 100 UNIT/ML injection Commonly known as:  LANTUS Inject 30 Units into the skin 2 (two) times daily.   Iron 325 (65 Fe) MG Tabs Take 325 mg by mouth every morning.   lisinopril 20 MG tablet Commonly known as:  PRINIVIL,ZESTRIL Take 20 mg by mouth at bedtime.   Magnesium 400 MG Caps Take 400 mg by mouth every morning.   metoprolol succinate 50 MG 24 hr tablet Commonly known as:  TOPROL-XL Take 50 mg by mouth every morning. Take with or immediately following a meal.   multivitamin with minerals  Tabs tablet Take 1 tablet by mouth every morning.   omeprazole 20 MG capsule Commonly known as:  PRILOSEC Take 20 mg by mouth daily.   oxyCODONE 5 MG immediate release tablet Commonly known as:  Oxy IR/ROXICODONE Take 1 tablet (5 mg total) by mouth every 6 (six) hours as needed for moderate pain or severe pain. What changed:  when to take this   oxyCODONE 5 MG immediate release tablet Commonly known as:  Oxy IR/ROXICODONE Take 1 tablet (5 mg total) by mouth every 4 (four) hours as needed for moderate pain or severe pain. What changed:  You were already taking a medication with the same name, and this prescription was added. Make sure you understand how and when to take each.   potassium chloride SA 20 MEQ tablet Commonly known as:  K-DUR,KLOR-CON Take 20 mEq by mouth daily.   psyllium 58.6 % powder Commonly known as:  METAMUCIL Take 1 packet by mouth daily.   rosuvastatin 10 MG tablet Commonly known as:  CRESTOR Take 10 mg by mouth every morning.   sildenafil 20 MG tablet Commonly known as:  REVATIO Take 20 mg by mouth as needed (for ED).   testosterone cypionate 200 MG/ML injection Commonly known as:  DEPOTESTOSTERONE CYPIONATE Inject 200 mg into the muscle every 14 (fourteen) days.   traZODone 50 MG  tablet Commonly known as:  DESYREL Take 100 mg by mouth at bedtime.   Vitamin D 2000 units Caps Take 2,000 Units by mouth every morning.      Verbal and written Discharge instructions given to the patient. Wound care per Discharge AVS Follow-up Information    Early, Todd, MD Follow up in 2 week(s).   Specialties:  Vascular Surgery, Cardiology Why:  Office will call you to arrange your appt (sent) Contact information: [address] [place] [address] [phone]           Signed: Laurence Slate [facility] 07/01/2016, 10:54 AM --- For VQI Registry use --- Instructions: Press F2 to tab through selections.  Delete question if not applicable.   Modified Rankin score at D/C (0-6): Rankin Score=0  IV medication needed for:  1. Hypertension: No 2. Hypotension: No  Post-op Complications: No  1. Post-op CVA or TIA: No  If yes: Event classification (right eye, left eye, right cortical, left cortical, verterobasilar, other):   If yes: Timing of event (intra-op, <6 hrs post-op, >=6 hrs post-op, unknown):   2. CN injury: No  If yes: CN  injuried   3. Myocardial infarction: No  If yes: Dx by (EKG or clinical, Troponin):   4.  CHF: No  5.  Dysrhythmia (new): No  6. Wound infection: No  7. Reperfusion symptoms: No  8. Return to OR: No  If yes: return to OR for (bleeding, neurologic, other CEA incision, other):   Discharge medications: Statin use:  Yes ASA use:  Yes Beta blocker use:  Yes ACE-Inhibitor use:  Yes P2Y12 Antagonist use: [ ]  None, [ ]  Plavix, [ ]  Plasugrel, [ ]  Ticlopinine, [ ]  Ticagrelor, [ ]  Other, [ ]  No for medical reason, [ ]  Non-compliant, [ ]  Not-indicated Anti-coagulant use:  [ ]  None, [ ]  Warfarin, [ ]  Rivaroxaban, [ ]  Dabigatran, [ ]  Other, [ ]  No for medical reason, [ ]  Non-compliant, [ ]  Not-indicated

## 2016-07-04 DIAGNOSIS — D81818 Other biotin-dependent carboxylase deficiency: Secondary | ICD-10-CM | POA: Diagnosis not present

## 2016-07-04 DIAGNOSIS — E291 Testicular hypofunction: Secondary | ICD-10-CM | POA: Diagnosis not present

## 2016-07-13 ENCOUNTER — Encounter: Payer: Self-pay | Admitting: Vascular Surgery

## 2016-07-18 DIAGNOSIS — E291 Testicular hypofunction: Secondary | ICD-10-CM | POA: Diagnosis not present

## 2016-07-19 ENCOUNTER — Ambulatory Visit (INDEPENDENT_AMBULATORY_CARE_PROVIDER_SITE_OTHER): Payer: Medicare Other | Admitting: Vascular Surgery

## 2016-07-19 VITALS — BP 115/63 | HR 72 | Temp 97.0°F | Resp 16 | Ht 66.0 in | Wt 184.0 lb

## 2016-07-19 DIAGNOSIS — I6523 Occlusion and stenosis of bilateral carotid arteries: Secondary | ICD-10-CM

## 2016-07-19 NOTE — Progress Notes (Signed)
Patient name: Javier Potter MRN: [id number] DOB: Oct 12, 1945 Sex: male  REASON FOR VISIT: Follow-up right carotid endarterectomy for symptomatic carotid disease on 06/22/2016  HPI: Javier Potter is a 70 y.o. male here for follow-up. He reports occasional floaters in his eyes bilaterally more so on the right. No episodes of the amaurosis or other neurologic deficits. He reports the typical. Incisional numbness but no wound issues.  Current Outpatient Prescriptions  Medication Sig Dispense Refill  . acetaminophen (TYLENOL) 325 MG tablet Take 325 mg by mouth every 6 (six) hours as needed for moderate pain or headache.    Marland Kitchen aspirin EC 81 MG tablet Take 81 mg by mouth at bedtime.    . cetirizine (ZYRTEC) 10 MG chewable tablet Chew 10 mg by mouth daily.    . Cholecalciferol (VITAMIN D) 2000 UNITS CAPS Take 2,000 Units by mouth every morning.     . citalopram (CELEXA) 20 MG tablet Take 20 mg by mouth daily.     . cyanocobalamin (,VITAMIN B-12,) 1000 MCG/ML injection Inject 1,000 mcg into the muscle every 30 (thirty) days.    . fenofibrate 160 MG tablet Take 160 mg by mouth at bedtime.     . Ferrous Sulfate (IRON) 325 (65 FE) MG TABS Take 325 mg by mouth every morning.     Marland Kitchen Fexofenadine HCl (ALLEGRA PO) Take 1 tablet by mouth daily.    Marland Kitchen glucosamine-chondroitin 500-400 MG tablet Take 2 tablets by mouth daily.    . insulin aspart (NOVOLOG) 100 UNIT/ML injection Inject 0-40 Units into the skin 3 (three) times daily with meals. Per sliding scale    . insulin glargine (LANTUS) 100 UNIT/ML injection Inject 30 Units into the skin 2 (two) times daily.     Marland Kitchen lisinopril (PRINIVIL,ZESTRIL) 20 MG tablet Take 20 mg by mouth at bedtime.    . Magnesium 400 MG CAPS Take 400 mg by mouth every morning.     . metoprolol succinate (TOPROL-XL) 50 MG 24 hr tablet Take 50 mg by mouth every morning. Take with or immediately following a meal.    . Multiple Vitamin (MULTIVITAMIN WITH  MINERALS) TABS tablet Take 1 tablet by mouth every morning.    . Omega-3 Fatty Acids (FISH OIL) 1000 MG CAPS Take 2,000 mg by mouth daily.     Marland Kitchen omeprazole (PRILOSEC) 20 MG capsule Take 20 mg by mouth daily.     . potassium chloride SA (K-DUR,KLOR-CON) 20 MEQ tablet Take 20 mEq by mouth daily.     . psyllium (METAMUCIL) 58.6 % powder Take 1 packet by mouth daily.     . rosuvastatin (CRESTOR) 10 MG tablet Take 10 mg by mouth every morning.    . sildenafil (REVATIO) 20 MG tablet Take 20 mg by mouth as needed (for ED).     Marland Kitchen testosterone cypionate (DEPOTESTOTERONE CYPIONATE) 200 MG/ML injection Inject 200 mg into the muscle every 14 (fourteen) days.     . traZODone (DESYREL) 50 MG tablet Take 100 mg by mouth at bedtime.      No current facility-administered medications for this visit.      PHYSICAL EXAM: Vitals:   07/19/16 1320 07/19/16 1323  BP: 121/60 115/63  Pulse: 72 72  Resp: 16   Temp: 97 F (36.1 C)   TempSrc: Oral   SpO2: 98%   Weight: 184 lb (83.5 kg)   Height: 5\' 6"  (1.676 m)     GENERAL: The patient is a well-nourished male, in no acute distress. The  vital signs are documented above. Right neck incision is healing quite nicely. There is no bruit in the right and does have a harsh bruit on the left.  Prior duplex revealed left external carotid stenosis but no internal carotid stenosis  MEDICAL ISSUES: Stable status post right carotid endarterectomy for symptomatic disease. We will continue full activity without limitation. We'll see him again in 6 months with repeat carotid duplex   Rosetta Posner, MD [facility] of [facility] Tel [phone] Pager [phone]

## 2016-07-20 NOTE — Addendum Note (Signed)
Addended by: Lianne Cure A on: 07/20/2016 01:56 PM   Modules accepted: Orders

## 2016-07-25 DIAGNOSIS — N183 Chronic kidney disease, stage 3 (moderate): Secondary | ICD-10-CM | POA: Diagnosis not present

## 2016-07-25 DIAGNOSIS — E291 Testicular hypofunction: Secondary | ICD-10-CM | POA: Diagnosis not present

## 2016-07-25 DIAGNOSIS — Z794 Long term (current) use of insulin: Secondary | ICD-10-CM | POA: Diagnosis not present

## 2016-07-25 DIAGNOSIS — Z5181 Encounter for therapeutic drug level monitoring: Secondary | ICD-10-CM | POA: Diagnosis not present

## 2016-07-25 DIAGNOSIS — E1122 Type 2 diabetes mellitus with diabetic chronic kidney disease: Secondary | ICD-10-CM | POA: Diagnosis not present

## 2016-07-27 DIAGNOSIS — J0110 Acute frontal sinusitis, unspecified: Secondary | ICD-10-CM | POA: Diagnosis not present

## 2016-08-01 DIAGNOSIS — E291 Testicular hypofunction: Secondary | ICD-10-CM | POA: Diagnosis not present

## 2016-08-01 DIAGNOSIS — D649 Anemia, unspecified: Secondary | ICD-10-CM | POA: Diagnosis not present

## 2016-08-02 ENCOUNTER — Ambulatory Visit: Payer: Medicare Other | Admitting: Vascular Surgery

## 2016-08-02 ENCOUNTER — Encounter (HOSPITAL_COMMUNITY): Payer: Medicare Other

## 2016-08-10 DIAGNOSIS — I150 Renovascular hypertension: Secondary | ICD-10-CM | POA: Diagnosis not present

## 2016-08-10 DIAGNOSIS — N183 Chronic kidney disease, stage 3 (moderate): Secondary | ICD-10-CM | POA: Diagnosis not present

## 2016-08-10 DIAGNOSIS — Z6830 Body mass index (BMI) 30.0-30.9, adult: Secondary | ICD-10-CM | POA: Diagnosis not present

## 2016-08-11 ENCOUNTER — Other Ambulatory Visit: Payer: Self-pay | Admitting: Nephrology

## 2016-08-11 DIAGNOSIS — N183 Chronic kidney disease, stage 3 unspecified: Secondary | ICD-10-CM

## 2016-08-11 DIAGNOSIS — Z6830 Body mass index (BMI) 30.0-30.9, adult: Secondary | ICD-10-CM

## 2016-08-15 DIAGNOSIS — E291 Testicular hypofunction: Secondary | ICD-10-CM | POA: Diagnosis not present

## 2016-08-24 ENCOUNTER — Ambulatory Visit
Admission: RE | Admit: 2016-08-24 | Discharge: 2016-08-24 | Disposition: A | Discharge status: Home / Self Care | Payer: Medicare Other | Source: Ambulatory Visit | Attending: Nephrology | Admitting: Nephrology

## 2016-08-24 DIAGNOSIS — N183 Chronic kidney disease, stage 3 unspecified: Secondary | ICD-10-CM

## 2016-08-24 DIAGNOSIS — Z6830 Body mass index (BMI) 30.0-30.9, adult: Secondary | ICD-10-CM

## 2016-08-24 DIAGNOSIS — N189 Chronic kidney disease, unspecified: Secondary | ICD-10-CM | POA: Diagnosis not present

## 2016-08-30 DIAGNOSIS — D649 Anemia, unspecified: Secondary | ICD-10-CM | POA: Diagnosis not present

## 2016-08-30 DIAGNOSIS — E291 Testicular hypofunction: Secondary | ICD-10-CM | POA: Diagnosis not present

## 2016-09-13 DIAGNOSIS — E291 Testicular hypofunction: Secondary | ICD-10-CM | POA: Diagnosis not present

## 2016-09-13 DIAGNOSIS — D649 Anemia, unspecified: Secondary | ICD-10-CM | POA: Diagnosis not present

## 2016-09-22 DIAGNOSIS — H35033 Hypertensive retinopathy, bilateral: Secondary | ICD-10-CM | POA: Diagnosis not present

## 2016-09-27 DIAGNOSIS — E291 Testicular hypofunction: Secondary | ICD-10-CM | POA: Diagnosis not present

## 2016-10-11 DIAGNOSIS — E291 Testicular hypofunction: Secondary | ICD-10-CM | POA: Diagnosis not present

## 2016-10-25 DIAGNOSIS — E291 Testicular hypofunction: Secondary | ICD-10-CM | POA: Diagnosis not present

## 2016-10-25 DIAGNOSIS — E538 Deficiency of other specified B group vitamins: Secondary | ICD-10-CM | POA: Diagnosis not present

## 2016-11-08 DIAGNOSIS — E291 Testicular hypofunction: Secondary | ICD-10-CM | POA: Diagnosis not present

## 2016-11-14 DIAGNOSIS — J0110 Acute frontal sinusitis, unspecified: Secondary | ICD-10-CM | POA: Diagnosis not present

## 2016-11-22 DIAGNOSIS — E291 Testicular hypofunction: Secondary | ICD-10-CM | POA: Diagnosis not present

## 2016-11-22 DIAGNOSIS — D649 Anemia, unspecified: Secondary | ICD-10-CM | POA: Diagnosis not present

## 2016-12-06 DIAGNOSIS — E291 Testicular hypofunction: Secondary | ICD-10-CM | POA: Diagnosis not present

## 2016-12-12 DIAGNOSIS — Z794 Long term (current) use of insulin: Secondary | ICD-10-CM | POA: Diagnosis not present

## 2016-12-12 DIAGNOSIS — E1122 Type 2 diabetes mellitus with diabetic chronic kidney disease: Secondary | ICD-10-CM | POA: Diagnosis not present

## 2016-12-12 DIAGNOSIS — J309 Allergic rhinitis, unspecified: Secondary | ICD-10-CM | POA: Diagnosis not present

## 2016-12-12 DIAGNOSIS — H1013 Acute atopic conjunctivitis, bilateral: Secondary | ICD-10-CM | POA: Diagnosis not present

## 2016-12-12 DIAGNOSIS — M19049 Primary osteoarthritis, unspecified hand: Secondary | ICD-10-CM | POA: Diagnosis not present

## 2016-12-12 DIAGNOSIS — I10 Essential (primary) hypertension: Secondary | ICD-10-CM | POA: Diagnosis not present

## 2016-12-12 DIAGNOSIS — E1121 Type 2 diabetes mellitus with diabetic nephropathy: Secondary | ICD-10-CM | POA: Diagnosis not present

## 2016-12-20 DIAGNOSIS — E538 Deficiency of other specified B group vitamins: Secondary | ICD-10-CM | POA: Diagnosis not present

## 2016-12-20 DIAGNOSIS — E291 Testicular hypofunction: Secondary | ICD-10-CM | POA: Diagnosis not present

## 2017-01-03 DIAGNOSIS — E291 Testicular hypofunction: Secondary | ICD-10-CM | POA: Diagnosis not present

## 2017-01-10 ENCOUNTER — Encounter: Payer: Self-pay | Admitting: Vascular Surgery

## 2017-01-17 ENCOUNTER — Ambulatory Visit (INDEPENDENT_AMBULATORY_CARE_PROVIDER_SITE_OTHER): Payer: Medicare Other | Admitting: Vascular Surgery

## 2017-01-17 ENCOUNTER — Ambulatory Visit (HOSPITAL_COMMUNITY)
Admission: RE | Admit: 2017-01-17 | Discharge: 2017-01-17 | Disposition: A | Discharge status: Home / Self Care | Payer: Medicare Other | Source: Ambulatory Visit | Attending: Vascular Surgery | Admitting: Vascular Surgery

## 2017-01-17 ENCOUNTER — Encounter: Payer: Self-pay | Admitting: Vascular Surgery

## 2017-01-17 VITALS — BP 147/73 | HR 68 | Temp 98.0°F | Resp 20 | Ht 66.0 in | Wt 189.0 lb

## 2017-01-17 DIAGNOSIS — D649 Anemia, unspecified: Secondary | ICD-10-CM | POA: Diagnosis not present

## 2017-01-17 DIAGNOSIS — I6523 Occlusion and stenosis of bilateral carotid arteries: Secondary | ICD-10-CM

## 2017-01-17 DIAGNOSIS — E291 Testicular hypofunction: Secondary | ICD-10-CM | POA: Diagnosis not present

## 2017-01-17 NOTE — Progress Notes (Signed)
[facility]ist of [place]  Patient name: Javier Potter MRN: [id number] DOB: May 12, 1946 Sex: male  REASON FOR VISIT: Follow-up right carotid endarterectomy on 06/22/2016  HPI: Javier Potter is a 71 y.o. male here for follow-up. He underwent right carotid endarterectomy for right visual changes. He reports occasional floaters in his left eye but has had no episodes of amaurosis. He has had no TIA or strokelike symptoms. He remains stable from a cardiac standpoint as well. Does report some hip discomfort which appears to be musculoskeletal  Past Medical History:  Diagnosis Date  . Anemia    hx  . Arthritis    "hands" (06/22/2016)  . Carotid stenosis, right   . Chronic kidney disease (CKD), stage III (moderate)   . GERD (gastroesophageal reflux disease)    under control  . High cholesterol   . Hypertension   . Murmur    had since childhood-no problems  . Sleep apnea    "can't tolerate mask" (06/22/2016)  . Type II diabetes mellitus ([place])     No family history on file.  SOCIAL HISTORY: Social History  Substance Use Topics  . Smoking status: Former Smoker    Packs/day: 3.00    Years: 40.00    Types: Cigarettes    Quit date: 06/18/1999  . Smokeless tobacco: Never Used  . Alcohol use Yes     Comment: 06/22/2016 "might have a couple drinks/year"    Allergies  Allergen Reactions  . Augmentin [Amoxicillin-Pot Clavulanate] Swelling and Other (See Comments)    Tongue swells Patient states has tolerated amoxicillin  . Sulfa Antibiotics Swelling and Other (See Comments)    Tongue swells  . Ambien [Zolpidem Tartrate] Other (See Comments)    "did not sit well with system..made me go crazy"    Current Outpatient Prescriptions  Medication Sig Dispense Refill  . acetaminophen (TYLENOL) 325 MG tablet Take 325 mg by mouth every 6 (six) hours as needed for moderate pain or headache.    Marland Kitchen aspirin EC 81 MG tablet Take 81 mg by mouth at  bedtime.    . cetirizine (ZYRTEC) 10 MG chewable tablet Chew 10 mg by mouth daily.    . Cholecalciferol (VITAMIN D) 2000 UNITS CAPS Take 2,000 Units by mouth every morning.     . citalopram (CELEXA) 20 MG tablet Take 20 mg by mouth daily.     . cyanocobalamin (,VITAMIN B-12,) 1000 MCG/ML injection Inject 1,000 mcg into the muscle every 30 (thirty) days.    . fenofibrate 160 MG tablet Take 160 mg by mouth at bedtime.     . Ferrous Sulfate (IRON) 325 (65 FE) MG TABS Take 325 mg by mouth every morning.     Marland Kitchen Fexofenadine HCl (ALLEGRA PO) Take 1 tablet by mouth daily.    Marland Kitchen glucosamine-chondroitin 500-400 MG tablet Take 2 tablets by mouth daily.    . insulin aspart (NOVOLOG) 100 UNIT/ML injection Inject 0-40 Units into the skin 3 (three) times daily with meals. Per sliding scale    . insulin glargine (LANTUS) 100 UNIT/ML injection Inject 30 Units into the skin 2 (two) times daily.     Marland Kitchen lisinopril (PRINIVIL,ZESTRIL) 20 MG tablet Take 20 mg by mouth at bedtime.    . Magnesium 400 MG CAPS Take 400 mg by mouth every morning.     . metoprolol succinate (TOPROL-XL) 50 MG 24 hr tablet Take 50 mg by mouth every morning. Take with or immediately following a meal.    .  Multiple Vitamin (MULTIVITAMIN WITH MINERALS) TABS tablet Take 1 tablet by mouth every morning.    . Omega-3 Fatty Acids (FISH OIL) 1000 MG CAPS Take 2,000 mg by mouth daily.     Marland Kitchen omeprazole (PRILOSEC) 20 MG capsule Take 20 mg by mouth daily.     . potassium chloride SA (K-DUR,KLOR-CON) 20 MEQ tablet Take 20 mEq by mouth daily.     . psyllium (METAMUCIL) 58.6 % powder Take 1 packet by mouth daily.     . rosuvastatin (CRESTOR) 10 MG tablet Take 10 mg by mouth every morning.    . sildenafil (REVATIO) 20 MG tablet Take 20 mg by mouth as needed (for ED).     Marland Kitchen testosterone cypionate (DEPOTESTOTERONE CYPIONATE) 200 MG/ML injection Inject 200 mg into the muscle every 14 (fourteen) days.     . traZODone (DESYREL) 50 MG tablet Take 100 mg by mouth at  bedtime.      No current facility-administered medications for this visit.     REVIEW OF SYSTEMS:  [X]  denotes positive finding, [ ]  denotes negative finding Cardiac  Comments:  Chest pain or chest pressure:    Shortness of breath upon exertion:    Short of breath when lying flat:    Irregular heart rhythm:        Vascular    Pain in calf, thigh, or hip brought on by ambulation:    Pain in feet at night that wakes you up from your sleep:     Blood clot in your veins:    Leg swelling:           PHYSICAL EXAM: Vitals:   01/17/17 1131 01/17/17 1134  BP: (!) 143/72 (!) 147/73  Pulse: 68   Resp: 20   Temp: 98 F (36.7 C)   TempSrc: Oral   SpO2: 96%   Weight: 189 lb (85.7 kg)   Height: 5\' 6"  (1.676 m)     GENERAL: The patient is a well-nourished male, in no acute distress. The vital signs are documented above. CARDIOVASCULAR: Harsh left carotid bruit and soft right carotid bruit. Well-healed right carotid incision PULMONARY: There is good air exchange  MUSCULOSKELETAL: There are no major deformities or cyanosis. NEUROLOGIC: No focal weakness or paresthesias are detected. SKIN: There are no ulcers or rashes noted. PSYCHIATRIC: The patient has a normal affect.  DATA:  Carotid duplex today was reviewed with the patient. This shows widely patent endarterectomy with no evidence of recurrent stenosis. Left internal carotid artery is widely patent as well. He does have a high-grade external carotid stenosis.  MEDICAL ISSUES: Stable overall. We'll continue his usual activities. We will see him again in 6 months and assuming no change within drop back to yearly surveillance. I did explain that he has physical exam a harsh left carotid bruit which is related to his left external carotid stenosis which has no clinical significance. We will see him again in 6 months.    Rosetta Posner, MD FACS [facility] of [facility] Tel [phone] Pager [phone]

## 2017-01-31 ENCOUNTER — Other Ambulatory Visit: Payer: Self-pay | Admitting: Internal Medicine

## 2017-01-31 DIAGNOSIS — K30 Functional dyspepsia: Secondary | ICD-10-CM | POA: Diagnosis not present

## 2017-01-31 DIAGNOSIS — R140 Abdominal distension (gaseous): Secondary | ICD-10-CM

## 2017-01-31 DIAGNOSIS — E1122 Type 2 diabetes mellitus with diabetic chronic kidney disease: Secondary | ICD-10-CM | POA: Diagnosis not present

## 2017-01-31 DIAGNOSIS — N183 Chronic kidney disease, stage 3 (moderate): Secondary | ICD-10-CM | POA: Diagnosis not present

## 2017-01-31 DIAGNOSIS — Z794 Long term (current) use of insulin: Secondary | ICD-10-CM | POA: Diagnosis not present

## 2017-01-31 DIAGNOSIS — K7460 Unspecified cirrhosis of liver: Secondary | ICD-10-CM | POA: Diagnosis not present

## 2017-01-31 DIAGNOSIS — E291 Testicular hypofunction: Secondary | ICD-10-CM | POA: Diagnosis not present

## 2017-01-31 DIAGNOSIS — Z5181 Encounter for therapeutic drug level monitoring: Secondary | ICD-10-CM | POA: Diagnosis not present

## 2017-02-03 ENCOUNTER — Ambulatory Visit
Admission: RE | Admit: 2017-02-03 | Discharge: 2017-02-03 | Disposition: A | Discharge status: Home / Self Care | Payer: Medicare Other | Source: Ambulatory Visit | Attending: Internal Medicine | Admitting: Internal Medicine

## 2017-02-03 DIAGNOSIS — R140 Abdominal distension (gaseous): Secondary | ICD-10-CM

## 2017-02-03 DIAGNOSIS — K7460 Unspecified cirrhosis of liver: Secondary | ICD-10-CM | POA: Diagnosis not present

## 2017-02-08 DIAGNOSIS — Z86010 Personal history of colonic polyps: Secondary | ICD-10-CM | POA: Diagnosis not present

## 2017-02-08 DIAGNOSIS — R110 Nausea: Secondary | ICD-10-CM | POA: Diagnosis not present

## 2017-02-08 DIAGNOSIS — E669 Obesity, unspecified: Secondary | ICD-10-CM | POA: Diagnosis not present

## 2017-02-08 DIAGNOSIS — K7460 Unspecified cirrhosis of liver: Secondary | ICD-10-CM | POA: Diagnosis not present

## 2017-02-10 DIAGNOSIS — Z6830 Body mass index (BMI) 30.0-30.9, adult: Secondary | ICD-10-CM | POA: Diagnosis not present

## 2017-02-10 DIAGNOSIS — N183 Chronic kidney disease, stage 3 (moderate): Secondary | ICD-10-CM | POA: Diagnosis not present

## 2017-02-10 DIAGNOSIS — I150 Renovascular hypertension: Secondary | ICD-10-CM | POA: Diagnosis not present

## 2017-02-13 DIAGNOSIS — I8500 Esophageal varices without bleeding: Secondary | ICD-10-CM | POA: Diagnosis not present

## 2017-02-13 DIAGNOSIS — K3189 Other diseases of stomach and duodenum: Secondary | ICD-10-CM | POA: Diagnosis not present

## 2017-02-13 DIAGNOSIS — Z13810 Encounter for screening for upper gastrointestinal disorder: Secondary | ICD-10-CM | POA: Diagnosis not present

## 2017-02-13 DIAGNOSIS — K766 Portal hypertension: Secondary | ICD-10-CM | POA: Diagnosis not present

## 2017-02-14 DIAGNOSIS — E291 Testicular hypofunction: Secondary | ICD-10-CM | POA: Diagnosis not present

## 2017-02-14 DIAGNOSIS — D649 Anemia, unspecified: Secondary | ICD-10-CM | POA: Diagnosis not present

## 2017-02-28 DIAGNOSIS — Z23 Encounter for immunization: Secondary | ICD-10-CM | POA: Diagnosis not present

## 2017-02-28 DIAGNOSIS — E291 Testicular hypofunction: Secondary | ICD-10-CM | POA: Diagnosis not present

## 2017-02-28 DIAGNOSIS — K7460 Unspecified cirrhosis of liver: Secondary | ICD-10-CM | POA: Diagnosis not present

## 2017-03-17 DIAGNOSIS — E291 Testicular hypofunction: Secondary | ICD-10-CM | POA: Diagnosis not present

## 2017-03-17 DIAGNOSIS — D649 Anemia, unspecified: Secondary | ICD-10-CM | POA: Diagnosis not present

## 2017-03-20 DIAGNOSIS — K7460 Unspecified cirrhosis of liver: Secondary | ICD-10-CM | POA: Diagnosis not present

## 2017-03-31 DIAGNOSIS — Z23 Encounter for immunization: Secondary | ICD-10-CM | POA: Diagnosis not present

## 2017-03-31 DIAGNOSIS — E291 Testicular hypofunction: Secondary | ICD-10-CM | POA: Diagnosis not present

## 2017-04-04 DIAGNOSIS — J0110 Acute frontal sinusitis, unspecified: Secondary | ICD-10-CM | POA: Diagnosis not present

## 2017-04-28 DIAGNOSIS — E291 Testicular hypofunction: Secondary | ICD-10-CM | POA: Diagnosis not present

## 2017-05-19 DIAGNOSIS — D509 Iron deficiency anemia, unspecified: Secondary | ICD-10-CM | POA: Diagnosis not present

## 2017-05-19 DIAGNOSIS — E291 Testicular hypofunction: Secondary | ICD-10-CM | POA: Diagnosis not present

## 2017-06-02 DIAGNOSIS — E291 Testicular hypofunction: Secondary | ICD-10-CM | POA: Diagnosis not present

## 2017-06-02 DIAGNOSIS — F5101 Primary insomnia: Secondary | ICD-10-CM | POA: Diagnosis not present

## 2017-06-02 DIAGNOSIS — K7460 Unspecified cirrhosis of liver: Secondary | ICD-10-CM | POA: Diagnosis not present

## 2017-06-02 DIAGNOSIS — Z23 Encounter for immunization: Secondary | ICD-10-CM | POA: Diagnosis not present

## 2017-06-02 DIAGNOSIS — N183 Chronic kidney disease, stage 3 (moderate): Secondary | ICD-10-CM | POA: Diagnosis not present

## 2017-06-02 DIAGNOSIS — K760 Fatty (change of) liver, not elsewhere classified: Secondary | ICD-10-CM | POA: Diagnosis not present

## 2017-06-06 DIAGNOSIS — E1140 Type 2 diabetes mellitus with diabetic neuropathy, unspecified: Secondary | ICD-10-CM | POA: Diagnosis not present

## 2017-06-06 DIAGNOSIS — M19049 Primary osteoarthritis, unspecified hand: Secondary | ICD-10-CM | POA: Diagnosis not present

## 2017-06-06 DIAGNOSIS — N183 Chronic kidney disease, stage 3 (moderate): Secondary | ICD-10-CM | POA: Diagnosis not present

## 2017-06-06 DIAGNOSIS — F329 Major depressive disorder, single episode, unspecified: Secondary | ICD-10-CM | POA: Diagnosis not present

## 2017-06-06 DIAGNOSIS — E1122 Type 2 diabetes mellitus with diabetic chronic kidney disease: Secondary | ICD-10-CM | POA: Diagnosis not present

## 2017-06-06 DIAGNOSIS — E1121 Type 2 diabetes mellitus with diabetic nephropathy: Secondary | ICD-10-CM | POA: Diagnosis not present

## 2017-06-06 DIAGNOSIS — I10 Essential (primary) hypertension: Secondary | ICD-10-CM | POA: Diagnosis not present

## 2017-06-06 DIAGNOSIS — E782 Mixed hyperlipidemia: Secondary | ICD-10-CM | POA: Diagnosis not present

## 2017-06-06 DIAGNOSIS — D649 Anemia, unspecified: Secondary | ICD-10-CM | POA: Diagnosis not present

## 2017-06-06 DIAGNOSIS — E1165 Type 2 diabetes mellitus with hyperglycemia: Secondary | ICD-10-CM | POA: Diagnosis not present

## 2017-06-08 DIAGNOSIS — K760 Fatty (change of) liver, not elsewhere classified: Secondary | ICD-10-CM | POA: Diagnosis not present

## 2017-06-08 DIAGNOSIS — F5101 Primary insomnia: Secondary | ICD-10-CM | POA: Diagnosis not present

## 2017-06-08 DIAGNOSIS — E291 Testicular hypofunction: Secondary | ICD-10-CM | POA: Diagnosis not present

## 2017-06-08 DIAGNOSIS — R188 Other ascites: Secondary | ICD-10-CM | POA: Diagnosis not present

## 2017-06-08 DIAGNOSIS — N183 Chronic kidney disease, stage 3 (moderate): Secondary | ICD-10-CM | POA: Diagnosis not present

## 2017-06-08 DIAGNOSIS — K7460 Unspecified cirrhosis of liver: Secondary | ICD-10-CM | POA: Diagnosis not present

## 2017-06-09 ENCOUNTER — Other Ambulatory Visit: Payer: Self-pay | Admitting: Physician Assistant

## 2017-06-09 ENCOUNTER — Ambulatory Visit
Admission: RE | Admit: 2017-06-09 | Discharge: 2017-06-09 | Disposition: A | Discharge status: Home / Self Care | Payer: Medicare Other | Source: Ambulatory Visit | Attending: Physician Assistant | Admitting: Physician Assistant

## 2017-06-09 DIAGNOSIS — R05 Cough: Secondary | ICD-10-CM | POA: Diagnosis not present

## 2017-06-09 DIAGNOSIS — R0602 Shortness of breath: Secondary | ICD-10-CM

## 2017-06-09 DIAGNOSIS — K7460 Unspecified cirrhosis of liver: Secondary | ICD-10-CM | POA: Diagnosis not present

## 2017-06-09 DIAGNOSIS — K760 Fatty (change of) liver, not elsewhere classified: Secondary | ICD-10-CM

## 2017-06-09 DIAGNOSIS — R188 Other ascites: Secondary | ICD-10-CM | POA: Diagnosis not present

## 2017-06-12 DIAGNOSIS — K7460 Unspecified cirrhosis of liver: Secondary | ICD-10-CM | POA: Diagnosis not present

## 2017-06-12 DIAGNOSIS — D649 Anemia, unspecified: Secondary | ICD-10-CM | POA: Diagnosis not present

## 2017-06-12 DIAGNOSIS — R188 Other ascites: Secondary | ICD-10-CM | POA: Diagnosis not present

## 2017-06-12 DIAGNOSIS — K760 Fatty (change of) liver, not elsewhere classified: Secondary | ICD-10-CM | POA: Diagnosis not present

## 2017-06-12 DIAGNOSIS — R05 Cough: Secondary | ICD-10-CM | POA: Diagnosis not present

## 2017-06-12 DIAGNOSIS — R0602 Shortness of breath: Secondary | ICD-10-CM | POA: Diagnosis not present

## 2017-06-13 ENCOUNTER — Encounter (HOSPITAL_COMMUNITY): Payer: Self-pay | Admitting: *Deleted

## 2017-06-13 ENCOUNTER — Emergency Department (HOSPITAL_COMMUNITY): Payer: Medicare Other

## 2017-06-13 ENCOUNTER — Emergency Department (HOSPITAL_COMMUNITY)
Admission: EM | Admit: 2017-06-13 | Discharge: 2017-06-13 | Disposition: A | Discharge status: Home / Self Care | Payer: Medicare Other | Attending: Emergency Medicine | Admitting: Emergency Medicine

## 2017-06-13 DIAGNOSIS — D61818 Other pancytopenia: Secondary | ICD-10-CM

## 2017-06-13 DIAGNOSIS — I129 Hypertensive chronic kidney disease with stage 1 through stage 4 chronic kidney disease, or unspecified chronic kidney disease: Secondary | ICD-10-CM | POA: Insufficient documentation

## 2017-06-13 DIAGNOSIS — D649 Anemia, unspecified: Secondary | ICD-10-CM | POA: Diagnosis not present

## 2017-06-13 DIAGNOSIS — Z794 Long term (current) use of insulin: Secondary | ICD-10-CM | POA: Diagnosis not present

## 2017-06-13 DIAGNOSIS — M6281 Muscle weakness (generalized): Secondary | ICD-10-CM | POA: Insufficient documentation

## 2017-06-13 DIAGNOSIS — Z79899 Other long term (current) drug therapy: Secondary | ICD-10-CM | POA: Insufficient documentation

## 2017-06-13 DIAGNOSIS — D509 Iron deficiency anemia, unspecified: Secondary | ICD-10-CM | POA: Diagnosis not present

## 2017-06-13 DIAGNOSIS — E1122 Type 2 diabetes mellitus with diabetic chronic kidney disease: Secondary | ICD-10-CM | POA: Insufficient documentation

## 2017-06-13 DIAGNOSIS — R531 Weakness: Secondary | ICD-10-CM

## 2017-06-13 DIAGNOSIS — R7989 Other specified abnormal findings of blood chemistry: Secondary | ICD-10-CM | POA: Diagnosis not present

## 2017-06-13 DIAGNOSIS — R0602 Shortness of breath: Secondary | ICD-10-CM | POA: Diagnosis not present

## 2017-06-13 DIAGNOSIS — Z7982 Long term (current) use of aspirin: Secondary | ICD-10-CM | POA: Diagnosis not present

## 2017-06-13 DIAGNOSIS — N183 Chronic kidney disease, stage 3 (moderate): Secondary | ICD-10-CM | POA: Insufficient documentation

## 2017-06-13 DIAGNOSIS — Z87891 Personal history of nicotine dependence: Secondary | ICD-10-CM | POA: Insufficient documentation

## 2017-06-13 DIAGNOSIS — R05 Cough: Secondary | ICD-10-CM | POA: Diagnosis not present

## 2017-06-13 LAB — BASIC METABOLIC PANEL
ANION GAP: 8 (ref 5–15)
BUN: 17 mg/dL (ref 6–20)
CHLORIDE: 105 mmol/L (ref 101–111)
CO2: 24 mmol/L (ref 22–32)
Calcium: 9.8 mg/dL (ref 8.9–10.3)
Creatinine, Ser: 1.14 mg/dL (ref 0.61–1.24)
Glucose, Bld: 194 mg/dL — ABNORMAL HIGH (ref 65–99)
POTASSIUM: 4.1 mmol/L (ref 3.5–5.1)
SODIUM: 137 mmol/L (ref 135–145)

## 2017-06-13 LAB — BRAIN NATRIURETIC PEPTIDE: B Natriuretic Peptide: 445.3 pg/mL — ABNORMAL HIGH (ref 0.0–100.0)

## 2017-06-13 LAB — CBC
HEMATOCRIT: 26.8 % — AB (ref 39.0–52.0)
HEMOGLOBIN: 7.9 g/dL — AB (ref 13.0–17.0)
MCH: 22.3 pg — ABNORMAL LOW (ref 26.0–34.0)
MCHC: 29.5 g/dL — ABNORMAL LOW (ref 30.0–36.0)
MCV: 75.7 fL — AB (ref 78.0–100.0)
Platelets: 62 10*3/uL — ABNORMAL LOW (ref 150–400)
RBC: 3.54 MIL/uL — AB (ref 4.22–5.81)
RDW: 17.5 % — ABNORMAL HIGH (ref 11.5–15.5)
WBC: 3.3 10*3/uL — AB (ref 4.0–10.5)

## 2017-06-13 LAB — HEPATIC FUNCTION PANEL
ALBUMIN: 3.9 g/dL (ref 3.5–5.0)
ALT: 20 U/L (ref 17–63)
AST: 23 U/L (ref 15–41)
Alkaline Phosphatase: 83 U/L (ref 38–126)
Bilirubin, Direct: <0.1 mg/dL — ABNORMAL LOW (ref 0.1–0.5)
TOTAL PROTEIN: 6.6 g/dL (ref 6.5–8.1)
Total Bilirubin: 0.5 mg/dL (ref 0.3–1.2)

## 2017-06-13 LAB — CBG MONITORING, ED: GLUCOSE-CAPILLARY: 186 mg/dL — AB (ref 65–99)

## 2017-06-13 LAB — URINALYSIS, ROUTINE W REFLEX MICROSCOPIC
Bilirubin Urine: NEGATIVE
GLUCOSE, UA: NEGATIVE mg/dL
HGB URINE DIPSTICK: NEGATIVE
Ketones, ur: NEGATIVE mg/dL
LEUKOCYTES UA: NEGATIVE
NITRITE: NEGATIVE
Protein, ur: 100 mg/dL — AB
Specific Gravity, Urine: 1.01 (ref 1.005–1.030)
pH: 5 (ref 5.0–8.0)

## 2017-06-13 LAB — TROPONIN I

## 2017-06-13 LAB — POC OCCULT BLOOD, ED: FECAL OCCULT BLD: NEGATIVE

## 2017-06-13 NOTE — ED Triage Notes (Signed)
To ED for eval of low hgb, weakness, and sob for past few weeks. States he has had endoscopy with neg results. No blood noted in stool. Pt appears weak. Alert and oriented. No pain.

## 2017-06-13 NOTE — ED Notes (Signed)
Pt ambulated without difficulty. SpO2 maintained between 98-100% on room air.

## 2017-06-13 NOTE — Discharge Instructions (Signed)
Continue to work with your gastroenterologist and your primary care provider to manage your anemia and liver issues. Return if symptoms are getting worse.

## 2017-06-13 NOTE — ED Provider Notes (Signed)
[facility] EMERGENCY DEPARTMENT Provider Note   CSN: [id number] Arrival date & time: 06/13/17  1656     History   Chief Complaint Chief Complaint  Patient presents with  . Weakness    HPI Javier Potter is a 71 y.o. male.  The history is provided by the patient.  He has a history of cirrhosis and anemia. He has noted worsening exertional dyspnea to the point where he can only walk about 50 feet before getting short of breath. He denies chest pain, heaviness, tightness, pressure. He has had problems with leg swelling, but that has decreased recently. His physician sent him to the ED for consideration for blood transfusion. Of note, he denies blood in his stool or dark stools.  Past Medical History:  Diagnosis Date  . Anemia    hx  . Arthritis    "hands" (06/22/2016)  . Carotid stenosis, right   . Chronic kidney disease (CKD), stage III (moderate) (HCC)   . GERD (gastroesophageal reflux disease)    under control  . High cholesterol   . Hypertension   . Murmur    had since childhood-no problems  . Sleep apnea    "can't tolerate mask" (06/22/2016)  . Type II diabetes mellitus [facility])     Patient Active Problem List   Diagnosis Date Noted  . Carotid artery disease ([place]) 06/22/2016  . History of laparoscopic cholecystectomy 11/14/2014    Past Surgical History:  Procedure Laterality Date  . CAROTID ENDARTERECTOMY Right 06/22/2016  . CATARACT EXTRACTION W/ INTRAOCULAR LENS  IMPLANT, BILATERAL Bilateral 09-2015, 11-2015  . CHOLECYSTECTOMY N/A 11/14/2014   Procedure: LAPAROSCOPIC CHOLECYSTECTOMY WITH INTRAOPERATIVE CHOLANGIOGRAM;  Surgeon: Johnathan Hausen, MD;  Location: [facility];  Service: General;  Laterality: N/A;  . COLONOSCOPY WITH PROPOFOL N/A 07/28/2014   Procedure: COLONOSCOPY WITH PROPOFOL;  Surgeon: Garlan Fair, MD;  [facility];  Service: Endoscopy;  Laterality: N/A;  . ENDARTERECTOMY Right 06/22/2016   Procedure: Right Carotid  ENDARTERECTOMY;  Surgeon: Rosetta Posner, MD;  Location: [place];  Service: Vascular;  Laterality: Right;  . ESOPHAGOGASTRODUODENOSCOPY (EGD) WITH PROPOFOL N/A 07/28/2014   Procedure: ESOPHAGOGASTRODUODENOSCOPY (EGD) WITH PROPOFOL;  Surgeon: Garlan Fair, MD;  [facility];  Service: Endoscopy;  Laterality: N/A;  . NASAL SINUS SURGERY  ~ 2000  . PATCH ANGIOPLASTY  06/22/2016   Procedure: PATCH ANGIOPLASTY;  Surgeon: Rosetta Posner, MD;  Location: [place];  Service: Vascular;;  . TONSILLECTOMY  age 54  . [place] EXTRACTION  1963?       Home Medications    Prior to Admission medications   Medication Sig Start Date End Date Taking? Authorizing Provider  acetaminophen (TYLENOL) 325 MG tablet Take 325 mg by mouth every 6 (six) hours as needed for moderate pain or headache.    [provider]  aspirin EC 81 MG tablet Take 81 mg by mouth at bedtime.    [provider]  cetirizine (ZYRTEC) 10 MG chewable tablet Chew 10 mg by mouth daily.    [provider]  Cholecalciferol (VITAMIN D) 2000 UNITS CAPS Take 2,000 Units by mouth every morning.     [provider]  citalopram (CELEXA) 20 MG tablet Take 20 mg by mouth daily.     [provider]  cyanocobalamin (,VITAMIN B-12,) 1000 MCG/ML injection Inject 1,000 mcg into the muscle every 30 (thirty) days.    [provider]  fenofibrate 160 MG tablet Take 160 mg by mouth at  bedtime.     [provider]  Ferrous Sulfate (IRON) 325 (65 FE) MG TABS Take 325 mg by mouth every morning.     [provider]  Fexofenadine HCl (ALLEGRA PO) Take 1 tablet by mouth daily.    [provider]  glucosamine-chondroitin 500-400 MG tablet Take 2 tablets by mouth daily.    [provider]  insulin aspart (NOVOLOG) 100 UNIT/ML injection Inject 0-40 Units into the skin 3 (three) times daily with meals. Per sliding scale    [provider]  insulin glargine (LANTUS)  100 UNIT/ML injection Inject 30 Units into the skin 2 (two) times daily.     [provider]  lisinopril (PRINIVIL,ZESTRIL) 20 MG tablet Take 20 mg by mouth at bedtime.    [provider]  Magnesium 400 MG CAPS Take 400 mg by mouth every morning.     [provider]  metoprolol succinate (TOPROL-XL) 50 MG 24 hr tablet Take 50 mg by mouth every morning. Take with or immediately following a meal.    [provider]  Multiple Vitamin (MULTIVITAMIN WITH MINERALS) TABS tablet Take 1 tablet by mouth every morning.    [provider]  Omega-3 Fatty Acids (FISH OIL) 1000 MG CAPS Take 2,000 mg by mouth daily.     [provider]  omeprazole (PRILOSEC) 20 MG capsule Take 20 mg by mouth daily.     [provider]  potassium chloride SA (K-DUR,KLOR-CON) 20 MEQ tablet Take 20 mEq by mouth daily.     [provider]  psyllium (METAMUCIL) 58.6 % powder Take 1 packet by mouth daily.     [provider]  rosuvastatin (CRESTOR) 10 MG tablet Take 10 mg by mouth every morning.    [provider]  sildenafil (REVATIO) 20 MG tablet Take 20 mg by mouth as needed (for ED).  04/18/16   [provider]  testosterone cypionate (DEPOTESTOTERONE CYPIONATE) 200 MG/ML injection Inject 200 mg into the muscle every 14 (fourteen) days.     [provider]  traZODone (DESYREL) 50 MG tablet Take 100 mg by mouth at bedtime.     [provider]    Family History No family history on file.  Social History Social History  Substance Use Topics  . Smoking status: Former Smoker    Packs/day: 3.00    Years: 40.00    Types: Cigarettes    Quit date: 06/18/1999  . Smokeless tobacco: Never Used  . Alcohol use Yes     Comment: 06/22/2016 "might have a couple drinks/year"     Allergies   Augmentin [amoxicillin-pot clavulanate]; Sulfa antibiotics; and Ambien [zolpidem tartrate]   Review of Systems Review of Systems    All other systems reviewed and are negative.    Physical Exam Updated Vital Signs BP (!) 169/73   Pulse 73   Temp 98.5 F (36.9 C) (Oral)   Resp 20   Ht 5\' 6"  (1.676 m)   Wt 86.2 kg (190 lb)   SpO2 97%   BMI 30.67 kg/m   Physical Exam  Nursing note and vitals reviewed.  71 year old male, resting comfortably and in no acute distress. Vital signs are significant for hypertension. Oxygen saturation is 97%, which is normal. Head is normocephalic and atraumatic. PERRLA, EOMI. Oropharynx is clear. Neck is nontender and supple without adenopathy or JVD. Back is nontender and there is no CVA tenderness. Lungs are clear without rales, wheezes, or rhonchi. Chest is nontender. Heart  has regular rate and rhythm with 2/6 systolic ejection murmur . Abdomen is soft, flat, nontender without masses or hepatosplenomegaly and peristalsis is normoactive. Extremities have 1+ edema, full range of motion is present. Skin is warm and dry without rash. Neurologic: Mental status is normal, cranial nerves are intact, there are no motor or sensory deficits.  ED Treatments / Results  Labs (all labs ordered are listed, but only abnormal results are displayed) Labs Reviewed  BASIC METABOLIC PANEL - Abnormal; Notable for the following:       Result Value   Glucose, Bld 194 (*)    All other components within normal limits  CBC - Abnormal; Notable for the following:    WBC 3.3 (*)    RBC 3.54 (*)    Hemoglobin 7.9 (*)    HCT 26.8 (*)    MCV 75.7 (*)    MCH 22.3 (*)    MCHC 29.5 (*)    RDW 17.5 (*)    Platelets 62 (*)    All other components within normal limits  URINALYSIS, ROUTINE W REFLEX MICROSCOPIC - Abnormal; Notable for the following:    APPearance CLOUDY (*)    Protein, ur 100 (*)    Bacteria, UA RARE (*)    Squamous Epithelial / LPF 0-5 (*)    All other components within normal limits  CBG MONITORING, ED - Abnormal; Notable for the following:    Glucose-Capillary 186 (*)    All  other components within normal limits  TROPONIN I  BRAIN NATRIURETIC PEPTIDE  HEPATIC FUNCTION PANEL  POC OCCULT BLOOD, ED    EKG  EKG Interpretation  Date/Time:  Tuesday June 13 2017 17:31:29 EDT Ventricular Rate:  64 PR Interval:  170 QRS Duration: 92 QT Interval:  418 QTC Calculation: 431 R Axis:   -20 Text Interpretation:  Normal sinus rhythm Anterior infarct , age undetermined Abnormal ECG When compared with ECG of 06/15/2016, No significant change was found Confirmed by Delora Fuel ([id number]) on 06/13/2017 6:59:07 PM       Radiology Dg Chest 2 View  Result Date: 06/13/2017 CLINICAL DATA:  Shortness of breath and cough EXAM: CHEST  2 VIEW COMPARISON:  June 09, 2017 FINDINGS: Lungs are clear. Heart size is upper normal with pulmonary vascularity within normal limits. No adenopathy. There is degenerative change in the thoracic spine. IMPRESSION: No edema or consolidation. Electronically Signed   By: Lowella Grip III M.D.   On: 06/13/2017 20:43    Procedures Procedures (including critical care time)  Medications Ordered in ED Medications - No data to display   Initial Impression / Assessment and Plan / ED Course  I have reviewed the triage vital signs and the nursing notes.  Pertinent labs & imaging results that were available during my care of the patient were reviewed by me and considered in my medical decision making (see chart for details).  Cirrhosis with peripheral edema and pancytopenia. Hemoglobin is 7.9, which is not a level where blood transfusion is usually indicated. Will check stool hemoccult, BNP, troponin. Old records are reviewed, and hemoglobin was normal one year ago  Stool Hemoccult is negative. Troponin is normal. BNP is mildly elevated. Orthostatic vital signs showed no significant change. He is able to ambulate with out any troponin I oxygen saturation. At this point, I do not feel red blood cell transfusion is warranted. That could change  if his clinical condition worsens or if his hemoglobin drops further. However, I feel that if transfusion is  needed, it should be done as a scheduled outpatient procedure and not through the emergency department. Consider hematology referral for evaluation of pancytopenia, although this is probably related to underlying cirrhosis. Return precautions discussed.  Final Clinical Impressions(s) / ED Diagnoses   Final diagnoses:  Microcytic anemia  Weakness  Shortness of breath  Pancytopenia (HCC)  Elevated brain natriuretic peptide (BNP) level    New Prescriptions New Prescriptions   No medications on file     Delora Fuel, MD 44/69/50 [id number]

## 2017-06-14 ENCOUNTER — Other Ambulatory Visit: Payer: Self-pay | Admitting: Physician Assistant

## 2017-06-14 ENCOUNTER — Ambulatory Visit
Admission: RE | Admit: 2017-06-14 | Discharge: 2017-06-14 | Disposition: A | Discharge status: Home / Self Care | Payer: Medicare Other | Source: Ambulatory Visit | Attending: Physician Assistant | Admitting: Physician Assistant

## 2017-06-14 DIAGNOSIS — R188 Other ascites: Secondary | ICD-10-CM | POA: Diagnosis not present

## 2017-06-14 DIAGNOSIS — Z1389 Encounter for screening for other disorder: Secondary | ICD-10-CM | POA: Diagnosis not present

## 2017-06-14 DIAGNOSIS — K760 Fatty (change of) liver, not elsewhere classified: Secondary | ICD-10-CM

## 2017-06-14 DIAGNOSIS — R935 Abnormal findings on diagnostic imaging of other abdominal regions, including retroperitoneum: Secondary | ICD-10-CM

## 2017-06-14 DIAGNOSIS — R0602 Shortness of breath: Secondary | ICD-10-CM | POA: Diagnosis not present

## 2017-06-14 DIAGNOSIS — R160 Hepatomegaly, not elsewhere classified: Secondary | ICD-10-CM

## 2017-06-14 DIAGNOSIS — Z0001 Encounter for general adult medical examination with abnormal findings: Secondary | ICD-10-CM | POA: Diagnosis not present

## 2017-06-14 DIAGNOSIS — R05 Cough: Secondary | ICD-10-CM | POA: Diagnosis not present

## 2017-06-14 DIAGNOSIS — K7460 Unspecified cirrhosis of liver: Secondary | ICD-10-CM | POA: Diagnosis not present

## 2017-06-14 DIAGNOSIS — D649 Anemia, unspecified: Secondary | ICD-10-CM | POA: Diagnosis not present

## 2017-06-16 ENCOUNTER — Ambulatory Visit
Admission: RE | Admit: 2017-06-16 | Discharge: 2017-06-16 | Disposition: A | Discharge status: Home / Self Care | Payer: Medicare Other | Source: Ambulatory Visit | Attending: Physician Assistant | Admitting: Physician Assistant

## 2017-06-16 ENCOUNTER — Other Ambulatory Visit: Payer: Self-pay | Admitting: Physician Assistant

## 2017-06-16 DIAGNOSIS — E538 Deficiency of other specified B group vitamins: Secondary | ICD-10-CM | POA: Diagnosis not present

## 2017-06-16 DIAGNOSIS — R935 Abnormal findings on diagnostic imaging of other abdominal regions, including retroperitoneum: Secondary | ICD-10-CM

## 2017-06-16 DIAGNOSIS — R160 Hepatomegaly, not elsewhere classified: Secondary | ICD-10-CM

## 2017-06-16 DIAGNOSIS — K7460 Unspecified cirrhosis of liver: Secondary | ICD-10-CM | POA: Diagnosis not present

## 2017-06-18 ENCOUNTER — Emergency Department (HOSPITAL_COMMUNITY)
Admission: EM | Admit: 2017-06-18 | Discharge: 2017-06-18 | Disposition: A | Discharge status: Home / Self Care | Payer: Medicare Other | Attending: Emergency Medicine | Admitting: Emergency Medicine

## 2017-06-18 ENCOUNTER — Emergency Department (HOSPITAL_COMMUNITY): Payer: Medicare Other

## 2017-06-18 ENCOUNTER — Encounter (HOSPITAL_COMMUNITY): Payer: Self-pay

## 2017-06-18 DIAGNOSIS — N183 Chronic kidney disease, stage 3 (moderate): Secondary | ICD-10-CM | POA: Insufficient documentation

## 2017-06-18 DIAGNOSIS — Z87891 Personal history of nicotine dependence: Secondary | ICD-10-CM | POA: Diagnosis not present

## 2017-06-18 DIAGNOSIS — K7460 Unspecified cirrhosis of liver: Secondary | ICD-10-CM | POA: Insufficient documentation

## 2017-06-18 DIAGNOSIS — Z79899 Other long term (current) drug therapy: Secondary | ICD-10-CM | POA: Insufficient documentation

## 2017-06-18 DIAGNOSIS — I130 Hypertensive heart and chronic kidney disease with heart failure and stage 1 through stage 4 chronic kidney disease, or unspecified chronic kidney disease: Secondary | ICD-10-CM | POA: Insufficient documentation

## 2017-06-18 DIAGNOSIS — E119 Type 2 diabetes mellitus without complications: Secondary | ICD-10-CM | POA: Insufficient documentation

## 2017-06-18 DIAGNOSIS — R9431 Abnormal electrocardiogram [ECG] [EKG]: Secondary | ICD-10-CM | POA: Diagnosis not present

## 2017-06-18 DIAGNOSIS — R0602 Shortness of breath: Secondary | ICD-10-CM | POA: Diagnosis not present

## 2017-06-18 DIAGNOSIS — Z7982 Long term (current) use of aspirin: Secondary | ICD-10-CM | POA: Diagnosis not present

## 2017-06-18 DIAGNOSIS — I2510 Atherosclerotic heart disease of native coronary artery without angina pectoris: Secondary | ICD-10-CM | POA: Insufficient documentation

## 2017-06-18 DIAGNOSIS — I509 Heart failure, unspecified: Secondary | ICD-10-CM | POA: Diagnosis not present

## 2017-06-18 DIAGNOSIS — R140 Abdominal distension (gaseous): Secondary | ICD-10-CM | POA: Diagnosis not present

## 2017-06-18 DIAGNOSIS — Z794 Long term (current) use of insulin: Secondary | ICD-10-CM | POA: Diagnosis not present

## 2017-06-18 DIAGNOSIS — R110 Nausea: Secondary | ICD-10-CM | POA: Insufficient documentation

## 2017-06-18 DIAGNOSIS — R1084 Generalized abdominal pain: Secondary | ICD-10-CM | POA: Diagnosis not present

## 2017-06-18 HISTORY — DX: Heart failure, unspecified: I50.9

## 2017-06-18 LAB — CBC
HEMATOCRIT: 31.7 % — AB (ref 39.0–52.0)
Hemoglobin: 9.1 g/dL — ABNORMAL LOW (ref 13.0–17.0)
MCH: 22.1 pg — ABNORMAL LOW (ref 26.0–34.0)
MCHC: 28.7 g/dL — AB (ref 30.0–36.0)
MCV: 76.9 fL — AB (ref 78.0–100.0)
PLATELETS: 63 10*3/uL — AB (ref 150–400)
RBC: 4.12 MIL/uL — ABNORMAL LOW (ref 4.22–5.81)
RDW: 18.4 % — AB (ref 11.5–15.5)
WBC: 4.5 10*3/uL (ref 4.0–10.5)

## 2017-06-18 LAB — HEPATIC FUNCTION PANEL
ALT: 25 U/L (ref 17–63)
AST: 31 U/L (ref 15–41)
Albumin: 4.1 g/dL (ref 3.5–5.0)
Alkaline Phosphatase: 85 U/L (ref 38–126)
BILIRUBIN DIRECT: 0.2 mg/dL (ref 0.1–0.5)
BILIRUBIN INDIRECT: 0.7 mg/dL (ref 0.3–0.9)
Total Bilirubin: 0.9 mg/dL (ref 0.3–1.2)
Total Protein: 7.1 g/dL (ref 6.5–8.1)

## 2017-06-18 LAB — BRAIN NATRIURETIC PEPTIDE: B Natriuretic Peptide: 317.7 pg/mL — ABNORMAL HIGH (ref 0.0–100.0)

## 2017-06-18 LAB — BASIC METABOLIC PANEL
Anion gap: 8 (ref 5–15)
BUN: 17 mg/dL (ref 6–20)
CO2: 23 mmol/L (ref 22–32)
CREATININE: 1.2 mg/dL (ref 0.61–1.24)
Calcium: 9.4 mg/dL (ref 8.9–10.3)
Chloride: 105 mmol/L (ref 101–111)
GFR calc Af Amer: >60 mL/min (ref >60)
GFR, EST NON AFRICAN AMERICAN: 60 mL/min — AB (ref >60)
GLUCOSE: 140 mg/dL — AB (ref 65–99)
POTASSIUM: 4.5 mmol/L (ref 3.5–5.1)
Sodium: 136 mmol/L (ref 135–145)

## 2017-06-18 LAB — I-STAT TROPONIN, ED
TROPONIN I, POC: 0.02 ng/mL (ref 0.00–0.08)
Troponin i, poc: 0.02 ng/mL (ref 0.00–0.08)

## 2017-06-18 MED ORDER — FUROSEMIDE 10 MG/ML IJ SOLN
40.0000 mg | Freq: Once | INTRAMUSCULAR | Status: AC
  Administered 2017-06-18: 40 mg via INTRAVENOUS
  Filled 2017-06-18: qty 4

## 2017-06-18 MED ORDER — ONDANSETRON HCL 4 MG/2ML IJ SOLN
4.0000 mg | Freq: Once | INTRAMUSCULAR | Status: AC
  Administered 2017-06-18: 4 mg via INTRAVENOUS
  Filled 2017-06-18: qty 2

## 2017-06-18 NOTE — ED Provider Notes (Signed)
[place] EMERGENCY DEPARTMENT Provider Note   CSN: [id number] Arrival date & time: 06/18/17  1014     History   Chief Complaint No chief complaint on file.   HPI Javier Potter is a 71 y.o. male.  Patient is a 71 year old male with a history of cirrhosis, CHF, chronic kidney disease, hypertension, diabetes and hyperlipidemia.  He states that he came in today because he feels like he is holding onto fluid.  He feels a little bit more short of breath than normal although he says that he always has baseline shortness of breath.  He says he is having a hard time laying flat although he says that it is not really changed from his baseline.  He denies any weight gain.  He has abdominal distention but states that that is unchanged from his baseline.  He has some nausea but no vomiting.  No abdominal pain.  No fevers.  He has a little bit of postnasal drip and a cough that is nonproductive.  No increase in his leg swelling.  He was here about a week ago with anemia but it was not felt that at that time he needed a blood transfusion.  He is followed by Dr. Paulita Fujita with gastroenterology.      Past Medical History:  Diagnosis Date  . Anemia    hx  . Arthritis    "hands" (06/22/2016)  . Carotid stenosis, right   . CHF (congestive heart failure) ([place])   . Chronic kidney disease (CKD), stage III (moderate) (HCC)   . GERD (gastroesophageal reflux disease)    under control  . High cholesterol   . Hypertension   . Murmur    had since childhood-no problems  . Sleep apnea    "can't tolerate mask" (06/22/2016)  . Type II diabetes mellitus [facility])     Patient Active Problem List   Diagnosis Date Noted  . Carotid artery disease ([place]) 06/22/2016  . History of laparoscopic cholecystectomy 11/14/2014    Past Surgical History:  Procedure Laterality Date  . CAROTID ENDARTERECTOMY Right 06/22/2016  . CATARACT EXTRACTION W/ INTRAOCULAR LENS  IMPLANT, BILATERAL Bilateral  09-2015, 11-2015  . CHOLECYSTECTOMY N/A 11/14/2014   Procedure: LAPAROSCOPIC CHOLECYSTECTOMY WITH INTRAOPERATIVE CHOLANGIOGRAM;  Surgeon: Johnathan Hausen, MD;  [facility];  Service: General;  Laterality: N/A;  . COLONOSCOPY WITH PROPOFOL N/A 07/28/2014   Procedure: COLONOSCOPY WITH PROPOFOL;  Surgeon: Garlan Fair, MD;  [facility];  Service: Endoscopy;  Laterality: N/A;  . ENDARTERECTOMY Right 06/22/2016   Procedure: Right Carotid ENDARTERECTOMY;  Surgeon: Rosetta Posner, MD;  Location: [place];  Service: Vascular;  Laterality: Right;  . ESOPHAGOGASTRODUODENOSCOPY (EGD) WITH PROPOFOL N/A 07/28/2014   Procedure: ESOPHAGOGASTRODUODENOSCOPY (EGD) WITH PROPOFOL;  Surgeon: Garlan Fair, MD;  [facility];  Service: Endoscopy;  Laterality: N/A;  . NASAL SINUS SURGERY  ~ 2000  . PATCH ANGIOPLASTY  06/22/2016   Procedure: PATCH ANGIOPLASTY;  Surgeon: Rosetta Posner, MD;  Location: [place];  Service: Vascular;;  . TONSILLECTOMY  age 47  . [place] EXTRACTION  1963?       Home Medications    Prior to Admission medications   Medication Sig Start Date End Date Taking? Authorizing Provider  aspirin EC 81 MG tablet Take 81 mg by mouth at bedtime.   Yes [provider]  Cholecalciferol (VITAMIN D) 2000 UNITS CAPS Take 2,000 Units by mouth every morning.    Yes [provider]  citalopram (  CELEXA) 20 MG tablet Take 20 mg by mouth daily.    Yes [provider]  cyanocobalamin (,VITAMIN B-12,) 1000 MCG/ML injection Inject 1,000 mcg into the muscle every 30 (thirty) days.   Yes [provider]  Ferrous Sulfate (IRON) 325 (65 FE) MG TABS Take 325 mg by mouth 2 (two) times daily.    Yes [provider]  fluticasone (FLONASE) 50 MCG/ACT nasal spray Place 1 spray into both nostrils 2 (two) times daily as needed for allergies or rhinitis.  06/14/17  Yes [provider]  furosemide (LASIX) 20 MG tablet Take 20 mg by mouth daily.  06/08/17  Yes [provider]  glucosamine-chondroitin 500-400 MG tablet Take 2 tablets by mouth daily.   Yes [provider]  insulin aspart (NOVOLOG) 100 UNIT/ML injection Inject 0-40 Units into the skin 3 (three) times daily with meals. Per sliding scale   Yes [provider]  insulin glargine (LANTUS) 100 UNIT/ML injection Inject 30 Units into the skin 3 (three) times daily.    Yes [provider]  lisinopril (PRINIVIL,ZESTRIL) 20 MG tablet Take 20 mg by mouth at bedtime.   Yes [provider]  metoprolol succinate (TOPROL-XL) 50 MG 24 hr tablet Take 50 mg by mouth every morning. Take with or immediately following a meal.   Yes [provider]  omeprazole (PRILOSEC) 20 MG capsule Take 20 mg by mouth 2 (two) times daily before a meal.    Yes [provider]  psyllium (METAMUCIL) 58.6 % powder Take 1 packet by mouth daily.    Yes [provider]  sildenafil (REVATIO) 20 MG tablet Take 20 mg by mouth as needed (for ED).  04/18/16  Yes [provider]  spironolactone (ALDACTONE) 50 MG tablet Take 50 mg by mouth 2 (two) times daily with a meal. 06/08/17  Yes [provider]  acetaminophen (TYLENOL) 325 MG tablet Take 325 mg by mouth every 6 (six) hours as needed for moderate pain or headache.    [provider]    Family History No family history on file.  Social History Social History  Substance Use Topics  . Smoking status: Former Smoker    Packs/day: 3.00    Years: 40.00    Types: Cigarettes    Quit date: 06/18/1999  . Smokeless tobacco: Never Used  . Alcohol use Yes     Comment: 06/22/2016 "might have a couple drinks/year"     Allergies   Augmentin [amoxicillin-pot clavulanate]; Sulfa antibiotics; and Ambien [zolpidem tartrate]   Review of Systems Review of Systems  Constitutional: Negative for chills, diaphoresis, fatigue and fever.  HENT: Negative for congestion, rhinorrhea and  sneezing.   Eyes: Negative.   Respiratory: Positive for shortness of breath. Negative for cough and chest tightness.   Cardiovascular: Negative for chest pain and leg swelling.  Gastrointestinal: Positive for abdominal distention and nausea. Negative for abdominal pain, blood in stool, diarrhea and vomiting.  Genitourinary: Negative for difficulty urinating, flank pain, frequency and hematuria.  Musculoskeletal: Negative for arthralgias and back pain.  Skin: Negative for rash.  Neurological: Negative for dizziness, speech difficulty, weakness, numbness and headaches.     Physical Exam Updated Vital Signs BP (!) 156/59   Pulse (!) 59   Temp 98.7 F (37.1 C) (Oral)   Resp 17   SpO2 98%   Physical Exam  Constitutional: He is oriented to person, place, and time. He appears well-developed and well-nourished.  HENT:  Head: Normocephalic and atraumatic.  Eyes: Pupils are equal, round, and reactive to light.  Neck: Normal range of motion. Neck supple.  Cardiovascular: Normal rate, regular rhythm and normal heart sounds.   Pulmonary/Chest: Effort normal and breath sounds normal. No respiratory distress. He has no wheezes. He has no rales. He exhibits no tenderness.  Abdominal: Soft. Bowel sounds are normal. He exhibits distension. There is no tenderness. There is no rebound and no guarding.  Tympany to percussion, no discernible fluid wave  Musculoskeletal: Normal range of motion. He exhibits edema.  1+ edema to lower extremities bilaterally, no calf tenderness  Lymphadenopathy:    He has no cervical adenopathy.  Neurological: He is alert and oriented to person, place, and time.  Skin: Skin is warm and dry. No rash noted.  Psychiatric: He has a normal mood and affect.     ED Treatments / Results  Labs (all labs ordered are listed, but only abnormal results are displayed) Labs Reviewed  BASIC METABOLIC PANEL - Abnormal; Notable for the following:       Result Value   Glucose, Bld  140 (*)    GFR calc non Af Amer 60 (*)    All other components within normal limits  CBC - Abnormal; Notable for the following:    RBC 4.12 (*)    Hemoglobin 9.1 (*)    HCT 31.7 (*)    MCV 76.9 (*)    MCH 22.1 (*)    MCHC 28.7 (*)    RDW 18.4 (*)    Platelets 63 (*)    All other components within normal limits  BRAIN NATRIURETIC PEPTIDE - Abnormal; Notable for the following:    B Natriuretic Peptide 317.7 (*)    All other components within normal limits  HEPATIC FUNCTION PANEL  I-STAT TROPONIN, ED  I-STAT TROPONIN, ED    EKG  EKG Interpretation  Date/Time:  Sunday June 18 2017 11:01:17 EDT Ventricular Rate:  59 PR Interval:  184 QRS Duration: 92 QT Interval:  436 QTC Calculation: 431 R Axis:   -3 Text Interpretation:  Sinus bradycardia Otherwise normal ECG since last tracing no significant change Confirmed by Malvin Johns [phone]) on 06/18/2017 11:34:15 AM       Radiology Dg Chest 2 View  Result Date: 06/18/2017 CLINICAL DATA:  Shortness of breath. EXAM: CHEST  2 VIEW COMPARISON:  06/13/2017 and prior radiographs FINDINGS: Cardiomegaly noted. There is no evidence of focal airspace disease, pulmonary edema, suspicious pulmonary nodule/mass, pleural effusion, or pneumothorax. No acute bony abnormalities are identified. IMPRESSION: Cardiomegaly without evidence of acute cardiopulmonary disease. Electronically Signed   By: Margarette Canada M.D.   On: 06/18/2017 11:41   Dg Abdomen 1 View  Result Date: 06/18/2017 CLINICAL DATA:  Pt reports generalized abdominal pain and abdominal distension since around 0230 this AM. He reports hx of obstruction x 2 EXAM: ABDOMEN - 1 VIEW COMPARISON:  None. FINDINGS: The bowel gas pattern is normal. No radio-opaque calculi or other significant radiographic abnormality are seen. IMPRESSION: No evidence bowel obstruction. Electronically Signed   By: Kathreen Devoid   On: 06/18/2017 12:49   Mr Liver Wo Conrtast  Result Date: 06/17/2017 CLINICAL  DATA:  Cirrhosis. Increased abdominal distention. Shortness of breath. Possible left hepatic lobe mass on recent ultrasound. EXAM: MRI ABDOMEN WITHOUT CONTRAST TECHNIQUE: Multiplanar multisequence MR imaging was performed without the administration of intravenous contrast. COMPARISON:  Ultrasound 06/14/2017 FINDINGS: Lower chest: No acute findings. Hepatobiliary: Image degradation by respiratory motion artifact noted. Hepatic cirrhosis. No masses visualized on  this unenhanced exam. No hepatic abnormality seen on diffusion-weighted imaging. Prior cholecystectomy. No evidence of biliary obstruction. Pancreas: No mass or inflammatory process visualized on this unenhanced exam. Spleen: Mild splenomegaly with length of 14-15 cm, consistent with portal venous hypertension. Adrenals/Urinary tract: Unremarkable. No mass or hydronephrosis seen on this unenhanced exam. Stomach/Bowel: No evidence of bowel obstruction. Vascular/Lymphatic: No pathologically enlarged lymph nodes identified. No evidence of abdominal aortic aneurysm. Other:  Mild to moderate ascites. Musculoskeletal:  No suspicious bone lesions identified. IMPRESSION: Hepatic cirrhosis. No evidence of hepatic mass on this unenhanced exam. Mild splenomegaly, consistent with portal venous hypertension. Mild to moderate ascites. Electronically Signed   By: Earle Gell M.D.   On: 06/17/2017 16:38    Procedures Procedures (including critical care time)  Medications Ordered in ED Medications  ondansetron (ZOFRAN) injection 4 mg (4 mg Intravenous Given 06/18/17 1336)  furosemide (LASIX) injection 40 mg (40 mg Intravenous Given 06/18/17 1337)     Initial Impression / Assessment and Plan / ED Course  I have reviewed the triage vital signs and the nursing notes.  Pertinent labs & imaging results that were available during my care of the patient were reviewed by me and considered in my medical decision making (see chart for details).     Patient is a  71 year old male with a history of CHF and cirrhosis who presents with some shortness of breath and abdominal distention.  He feels like he is holding onto extra fluid.  However in talking to him it does not sound like the symptoms are really any different than his baseline symptoms.  He was trying to do an MRI last week and was unable to lay flat for the MRI.  However he states that it is not unusual for him not to be able to lay flat but he just got concerned about it because of something that the MRI tech told him.  He has no evidence of fluid overload on x-ray.  He has no worsening abdominal distention.  I do not really feel a fluid wave.  There is no evidence of obstruction.  He has no symptoms that would be more consistent with ACS.  His troponin is negative.  His vital signs are stable.  I did give him an extra dose of Lasix in the ED and he feels like he is better after this.  I feel that he can be discharged home.  He has an appointment with Dr. Paulita Fujita on Wednesday.  Return precautions were given.  Final Clinical Impressions(s) / ED Diagnoses   Final diagnoses:  Shortness of breath  Abdominal distension    New Prescriptions New Prescriptions   No medications on file     Malvin Johns, MD 06/18/17 [phone]

## 2017-06-18 NOTE — ED Triage Notes (Signed)
Patient complains of increased fluid that he thinks related to CHF. No weight gain, no swelling. Denies CP. States that he is having a hard time resting at night. Alert and oriented.

## 2017-06-21 DIAGNOSIS — K7460 Unspecified cirrhosis of liver: Secondary | ICD-10-CM | POA: Diagnosis not present

## 2017-06-21 DIAGNOSIS — K766 Portal hypertension: Secondary | ICD-10-CM | POA: Diagnosis not present

## 2017-06-21 DIAGNOSIS — D509 Iron deficiency anemia, unspecified: Secondary | ICD-10-CM | POA: Diagnosis not present

## 2017-06-21 DIAGNOSIS — R188 Other ascites: Secondary | ICD-10-CM | POA: Diagnosis not present

## 2017-06-29 ENCOUNTER — Other Ambulatory Visit: Payer: Self-pay

## 2017-06-29 DIAGNOSIS — I6529 Occlusion and stenosis of unspecified carotid artery: Secondary | ICD-10-CM

## 2017-06-29 DIAGNOSIS — D509 Iron deficiency anemia, unspecified: Secondary | ICD-10-CM | POA: Diagnosis not present

## 2017-07-04 ENCOUNTER — Ambulatory Visit (INDEPENDENT_AMBULATORY_CARE_PROVIDER_SITE_OTHER): Payer: Medicare Other | Admitting: Oncology

## 2017-07-04 ENCOUNTER — Encounter: Payer: Self-pay | Admitting: Oncology

## 2017-07-04 VITALS — BP 127/50 | HR 62 | Temp 98.1°F | Ht 66.0 in | Wt 177.9 lb

## 2017-07-04 DIAGNOSIS — Z9049 Acquired absence of other specified parts of digestive tract: Secondary | ICD-10-CM | POA: Diagnosis not present

## 2017-07-04 DIAGNOSIS — D61818 Other pancytopenia: Secondary | ICD-10-CM | POA: Insufficient documentation

## 2017-07-04 DIAGNOSIS — D696 Thrombocytopenia, unspecified: Secondary | ICD-10-CM

## 2017-07-04 DIAGNOSIS — E1122 Type 2 diabetes mellitus with diabetic chronic kidney disease: Secondary | ICD-10-CM | POA: Diagnosis not present

## 2017-07-04 DIAGNOSIS — Z8379 Family history of other diseases of the digestive system: Secondary | ICD-10-CM

## 2017-07-04 DIAGNOSIS — R188 Other ascites: Secondary | ICD-10-CM

## 2017-07-04 DIAGNOSIS — Z882 Allergy status to sulfonamides status: Secondary | ICD-10-CM | POA: Diagnosis not present

## 2017-07-04 DIAGNOSIS — K7469 Other cirrhosis of liver: Secondary | ICD-10-CM | POA: Insufficient documentation

## 2017-07-04 DIAGNOSIS — Z881 Allergy status to other antibiotic agents status: Secondary | ICD-10-CM | POA: Diagnosis not present

## 2017-07-04 DIAGNOSIS — Z888 Allergy status to other drugs, medicaments and biological substances status: Secondary | ICD-10-CM

## 2017-07-04 DIAGNOSIS — Z7982 Long term (current) use of aspirin: Secondary | ICD-10-CM | POA: Diagnosis not present

## 2017-07-04 DIAGNOSIS — K766 Portal hypertension: Secondary | ICD-10-CM

## 2017-07-04 DIAGNOSIS — I129 Hypertensive chronic kidney disease with stage 1 through stage 4 chronic kidney disease, or unspecified chronic kidney disease: Secondary | ICD-10-CM

## 2017-07-04 DIAGNOSIS — I6523 Occlusion and stenosis of bilateral carotid arteries: Secondary | ICD-10-CM

## 2017-07-04 DIAGNOSIS — K439 Ventral hernia without obstruction or gangrene: Secondary | ICD-10-CM | POA: Diagnosis not present

## 2017-07-04 DIAGNOSIS — D732 Chronic congestive splenomegaly: Secondary | ICD-10-CM

## 2017-07-04 DIAGNOSIS — I8510 Secondary esophageal varices without bleeding: Secondary | ICD-10-CM

## 2017-07-04 DIAGNOSIS — Z794 Long term (current) use of insulin: Secondary | ICD-10-CM | POA: Diagnosis not present

## 2017-07-04 DIAGNOSIS — Z8249 Family history of ischemic heart disease and other diseases of the circulatory system: Secondary | ICD-10-CM

## 2017-07-04 DIAGNOSIS — N183 Chronic kidney disease, stage 3 (moderate): Secondary | ICD-10-CM | POA: Diagnosis not present

## 2017-07-04 DIAGNOSIS — Z87891 Personal history of nicotine dependence: Secondary | ICD-10-CM | POA: Diagnosis not present

## 2017-07-04 HISTORY — DX: Other cirrhosis of liver: K74.69

## 2017-07-04 HISTORY — DX: Other pancytopenia: D61.818

## 2017-07-04 LAB — CBC WITH DIFFERENTIAL/PLATELET
BASOS ABS: 0 10*3/uL (ref 0.0–0.1)
Basophils Relative: 1 %
EOS PCT: 4 %
Eosinophils Absolute: 0.1 10*3/uL (ref 0.0–0.7)
HCT: 34.1 % — ABNORMAL LOW (ref 39.0–52.0)
HEMOGLOBIN: 10.6 g/dL — AB (ref 13.0–17.0)
LYMPHS ABS: 0.4 10*3/uL — AB (ref 0.7–4.0)
LYMPHS PCT: 13 %
MCH: 24.9 pg — ABNORMAL LOW (ref 26.0–34.0)
MCHC: 31.1 g/dL (ref 30.0–36.0)
MCV: 80.2 fL (ref 78.0–100.0)
MONOS PCT: 11 %
Monocytes Absolute: 0.4 10*3/uL (ref 0.1–1.0)
Neutro Abs: 2.5 10*3/uL (ref 1.7–7.7)
Neutrophils Relative %: 71 %
Platelets: 89 10*3/uL — ABNORMAL LOW (ref 150–400)
RBC: 4.25 MIL/uL (ref 4.22–5.81)
RDW: 22.2 % — ABNORMAL HIGH (ref 11.5–15.5)
WBC: 3.4 10*3/uL — AB (ref 4.0–10.5)

## 2017-07-04 LAB — SAVE SMEAR

## 2017-07-04 NOTE — Patient Instructions (Signed)
To lab today Return visit 2-3 weeks to review results

## 2017-07-04 NOTE — Progress Notes (Signed)
New Patient Hematology   Javier Potter [id number] 05-Jun-1946 71 y.o. 07/04/2017  CC: Dr. Josetta Huddle; Dr. Arta Silence   Reason for referral: Pancytopenia in the setting of cryptogenic cirrhosis    HPI:  71 year old man, insulin-dependent type 2 diabetic, hypertensive, stage III chronic renal insufficiency.  About 2 years ago in March 2016 while under evaluation for gallbladder disease he was told that his liver appeared abnormal on ultrasound.  Diffuse nodular changes subsequently confirmed by direct inspection at time of laparoscopic cholecystectomy November 14, 2014.  He retired from a Herbalist position about 1 year ago.  He began to exercise on a regular basis.  He lost about 18 pounds.  His diabetes came under excellent control.  In June 2018 he developed rather sudden onset of profound fatigue, supine dyspnea, and abdominal distention and bloating.  Abdominal ultrasound done June 8 showed heterogeneous and coarsened hepatic parenchyma with a macronodular contour consistent with cirrhosis.  No focal mass.  Main portal vein patent.  Spleen upper limit of normal size.  Small volume of perihepatic and perisplenic ascites. He has subsequently had an extensive evaluation by gastroenterology.  Hepatitis A, B, C all negative.  Ferritin actually low at 14.8.  Serum immunoglobulins normal.  Negative antibodies against smooth muscle, negative ANA, normal ceruloplasmin level.  Lab all done in June. A repeat ultrasound and an MRI of the liver done in October.  Findings again consistent with cirrhosis.  No hepatic mass.  Spleen enlarged 14-15 cm consistent with portal venous hypertension.  Mild to moderate ascites. Of great interest, his liver functions including transaminases, albumin, and bilirubin, have remained normal. He has no prior history of hepatitis, yellow jaundice, malaria, or mononucleosis.  He has never worked with any toxic chemicals.  He used to drink 2-3 bourbon drinks on weekends  and at parties.  No alcohol at all for the last 7 years. No signs or symptoms of a collagen vascular disorder. His father and paternal grandfather died of complications of alcohol related cirrhosis.  He has a sister age 42 with carotid artery disease but no known liver disease.  He has a son aged 44 and a daughter age 81 and both are healthy.  He had mild thrombocytopenia documented at least as far back as December 2015 when platelet count was 127,000.  In October 2017, platelet count was 96,000, hemoglobin 14.8, MCV 88.5, white count 5200.  He had a rather abrupt fall in his white count and platelets recorded on January 31, 2017: Hemoglobin 13, hematocrit 40, white count 3500, platelets 70,000.  Most recent labs on October 17 with unexpected fall in hemoglobin 8.1, hematocrit 26.6, MCV 72, white count 3000, platelets 65,000. Upper endoscopy in June showed small esophageal varices and portal hypertensive gastropathy.  Small esophageal varices.   Significant acute fall in his hemoglobin, low ferritin, and microcytosis, suggest that he has had a subacute bleed.   PMH: Past Medical History:  Diagnosis Date  . Anemia    hx  . Arthritis    "hands" (06/22/2016)  . Carotid stenosis, right   . CHF (congestive heart failure) ([place])   . Chronic kidney disease (CKD), stage III (moderate) (HCC)   . Cirrhosis, cryptogenic ([place]) 07/04/2017  . GERD (gastroesophageal reflux disease)    under control  . High cholesterol   . Hypertension   . Murmur    had since childhood-no problems  . Pancytopenia ([place]) 07/04/2017  . Sleep apnea    "can't tolerate mask" (06/22/2016)  .  Type II diabetes mellitus ([place])     Past Surgical History:  Procedure Laterality Date  . CAROTID ENDARTERECTOMY Right 06/22/2016  . CATARACT EXTRACTION W/ INTRAOCULAR LENS  IMPLANT, BILATERAL Bilateral 09-2015, 11-2015  . NASAL SINUS SURGERY  ~ 2000  . TONSILLECTOMY  age 45  . [place] EXTRACTION  1963?    Allergies: Allergies   Allergen Reactions  . Augmentin [Amoxicillin-Pot Clavulanate] Swelling and Other (See Comments)    Tongue swells Patient states has tolerated amoxicillin  . Sulfa Antibiotics Swelling and Other (See Comments)    Tongue swells  . Ambien [Zolpidem Tartrate] Other (See Comments)    "did not sit well with system..made me go crazy"    Medications:  Current Outpatient Medications:  .  acetaminophen (TYLENOL) 325 MG tablet, Take 325 mg by mouth every 6 (six) hours as needed for moderate pain or headache., Disp: , Rfl:  .  aspirin EC 81 MG tablet, Take 81 mg by mouth at bedtime., Disp: , Rfl:  .  Cholecalciferol (VITAMIN D) 2000 UNITS CAPS, Take 2,000 Units by mouth every morning. , Disp: , Rfl:  .  citalopram (CELEXA) 20 MG tablet, Take 20 mg by mouth daily. , Disp: , Rfl:  .  cyanocobalamin (,VITAMIN B-12,) 1000 MCG/ML injection, Inject 1,000 mcg into the muscle every 30 (thirty) days., Disp: , Rfl:  .  Ferrous Sulfate (IRON) 325 (65 FE) MG TABS, Take 325 mg by mouth 2 (two) times daily. , Disp: , Rfl:  .  fluticasone (FLONASE) 50 MCG/ACT nasal spray, Place 1 spray into both nostrils 2 (two) times daily as needed for allergies or rhinitis. , Disp: , Rfl: 2 .  furosemide (LASIX) 20 MG tablet, Take 20 mg by mouth daily., Disp: , Rfl: 5 .  glucosamine-chondroitin 500-400 MG tablet, Take 2 tablets by mouth daily., Disp: , Rfl:  .  insulin aspart (NOVOLOG) 100 UNIT/ML injection, Inject 0-40 Units into the skin 3 (three) times daily with meals. Per sliding scale, Disp: , Rfl:  .  insulin glargine (LANTUS) 100 UNIT/ML injection, Inject 30 Units into the skin 3 (three) times daily. , Disp: , Rfl:  .  lisinopril (PRINIVIL,ZESTRIL) 20 MG tablet, Take 20 mg by mouth at bedtime., Disp: , Rfl:  .  metoprolol succinate (TOPROL-XL) 50 MG 24 hr tablet, Take 50 mg by mouth every morning. Take with or immediately following a meal., Disp: , Rfl:  .  omeprazole (PRILOSEC) 20 MG capsule, Take 20 mg by mouth 2 (two)  times daily before a meal. , Disp: , Rfl:  .  psyllium (METAMUCIL) 58.6 % powder, Take 1 packet by mouth daily. , Disp: , Rfl:  .  sildenafil (REVATIO) 20 MG tablet, Take 20 mg by mouth as needed (for ED). , Disp: , Rfl:  .  spironolactone (ALDACTONE) 50 MG tablet, Take 50 mg by mouth 2 (two) times daily with a meal., Disp: , Rfl: 5  Social History:   reports that he quit smoking about 18 years ago. His smoking use included cigarettes. He has a 120.00 pack-year smoking history. he has never used smokeless tobacco. He reports that he drinks alcohol. He reports that he does not use drugs.  Family History: Father and grandfather both died of complications of alcoholic cirrhosis.   Review of Systems: See HPI Remaining ROS negative.  Physical Exam: Blood pressure (!) 127/50, pulse 62, temperature 98.1 F (36.7 C), height 5' 6" (1.676 m), weight 177 lb 14.4 oz (80.7 kg), SpO2 100 %.  Wt Readings from Last 3 Encounters:  07/04/17 177 lb 14.4 oz (80.7 kg)  06/13/17 190 lb (86.2 kg)  01/17/17 189 lb (85.7 kg)     General appearance: Well-nourished Caucasian man HENNT: Male pattern baldness.  Pharynx no erythema, exudate, mass, or ulcer. No thyromegaly or thyroid nodules Lymph nodes: No cervical, supraclavicular, or axillary lymphadenopathy Breasts: No gynecomastia Lungs: Clear to auscultation, resonant to percussion throughout Heart: Regular rhythm, no murmur, no gallop, no rub, no click, no edema Abdomen: Soft, nontender, large ventral hernia, normal bowel sounds, no mass, no hepatomegaly, spleen tip palpable 2 cm below left costal margin, currently no positive fluid wave Extremities: No edema, no calf tenderness Musculoskeletal: no joint deformities GU:  Vascular: Carotid pulses 2+, bilateral loud carotid bruits louder on the left than the right. Neurologic: Alert, oriented, PERRLA, optic discs sharp and vessels normal, no hemorrhage or exudate, cranial nerves grossly normal, motor  strength 5 over 5, reflexes 1+ symmetric, upper body coordination normal, gait normal, Skin: Anicteric.  No rash or ecchymosis.  No spider hemangiomas.  Positive palmar erythema.    Lab Results: CBC today July 04, 2017: Hemoglobin 10.6, hematocrit 34, MCV 80, white count 3400, 71% neutrophils, 13 lymphocytes, 11 monocytes, 4 eosinophils, 1 basophil, platelet count 89,000. Lab Results:  Component Value Date   WBC 3.4 (L) 07/04/2017   HGB 10.6 (L) 07/04/2017   HCT 34.1 (L) 07/04/2017   MCV 80.2 07/04/2017   PLT 89 (L) 07/04/2017     Chemistry      Component Value Date/Time   NA 136 06/18/2017 1050   K 4.5 06/18/2017 1050   CL 105 06/18/2017 1050   CO2 23 06/18/2017 1050   BUN 17 06/18/2017 1050   CREATININE 1.20 06/18/2017 1050      Component Value Date/Time   CALCIUM 9.4 06/18/2017 1050   ALKPHOS 85 06/18/2017 1050   AST 31 06/18/2017 1050   ALT 25 06/18/2017 1050   BILITOT 0.9 06/18/2017 1050       Review of peripheral blood film: Mixed population normochromic and hypochromic red cells.  No spherocytes, schistocytes, polychromasia, or inclusions.  Neutrophils appear mature in granulation and lobation.  Platelets mildly decreased approximately 7 per high-power field with normal morphology.  No immature myeloid cells seen.  No red cell changes that would be typical of a myelodysplastic syndrome.  Radiological Studies: See discussion above    Impression: Pancytopenia  I think that the white count and platelet count are compatible with portal hypertension, congestive splenomegaly, and decreased thrombopoietin production in a compromised liver. I think the disproportionate fall in the hemoglobin correlated with new microcytosis , low ferritin of 14.8 recorded in June, and findings of esophageal varices, are compatible with a subacute bleed. He was recently started on oral iron replacement.  He has already had a rise in his hemoglobin from 8 g in October to 10.6 g today.  I  am repeating a ferritin again today to confirm the findings from June. I doubt very much that he has a primary bone marrow disorder to explain his pancytopenia.  I am intrigued with the fact that he has normal liver functions despite x-ray findings of advanced cirrhosis with portal hypertension and the development of ascites.  On October 21, albumin 4.1, AST 31, ALT 25, total protein 7.1, total bilirubin 0.9, alkaline phosphatase 85.  1 of the recent Doppler studies done in June included color blood flow and the portal vein appeared to be patent. Liver functions repeated  today and remain normal.     Recommendation: See discussion above. I would like to discuss his situation with his referring physician and his gastroenterologist.  I think a liver biopsy would probably yield more information than a bone marrow biopsy.     Murriel Hopper, MD, [place]  Hematology-Oncology/Internal Medicine  07/04/2017, 6:46 PM

## 2017-07-05 LAB — COMPREHENSIVE METABOLIC PANEL
A/G RATIO: 1.8 (ref 1.2–2.2)
ALBUMIN: 4.4 g/dL (ref 3.5–4.8)
ALK PHOS: 106 IU/L (ref 39–117)
ALT: 32 IU/L (ref 0–44)
AST: 26 IU/L (ref 0–40)
BILIRUBIN TOTAL: 0.4 mg/dL (ref 0.0–1.2)
BUN / CREAT RATIO: 25 — AB (ref 10–24)
BUN: 33 mg/dL — ABNORMAL HIGH (ref 8–27)
CHLORIDE: 103 mmol/L (ref 96–106)
CO2: 23 mmol/L (ref 20–29)
CREATININE: 1.32 mg/dL — AB (ref 0.76–1.27)
Calcium: 10.2 mg/dL (ref 8.6–10.2)
GFR calc Af Amer: 63 mL/min/{1.73_m2} (ref >59)
GFR calc non Af Amer: 54 mL/min/{1.73_m2} — ABNORMAL LOW (ref >59)
GLOBULIN, TOTAL: 2.5 g/dL (ref 1.5–4.5)
Glucose: 130 mg/dL — ABNORMAL HIGH (ref 65–99)
POTASSIUM: 4.4 mmol/L (ref 3.5–5.2)
SODIUM: 142 mmol/L (ref 134–144)
Total Protein: 6.9 g/dL (ref 6.0–8.5)

## 2017-07-05 LAB — FERRITIN: Ferritin: 51 ng/mL (ref 30–400)

## 2017-07-05 LAB — LACTATE DEHYDROGENASE: LDH: 163 IU/L (ref 121–224)

## 2017-07-05 LAB — RETICULOCYTES: RETIC CT PCT: 1.8 % (ref 0.6–2.6)

## 2017-07-12 DIAGNOSIS — N183 Chronic kidney disease, stage 3 (moderate): Secondary | ICD-10-CM | POA: Diagnosis not present

## 2017-07-12 DIAGNOSIS — K7460 Unspecified cirrhosis of liver: Secondary | ICD-10-CM | POA: Diagnosis not present

## 2017-07-12 DIAGNOSIS — E538 Deficiency of other specified B group vitamins: Secondary | ICD-10-CM | POA: Diagnosis not present

## 2017-07-12 DIAGNOSIS — D509 Iron deficiency anemia, unspecified: Secondary | ICD-10-CM | POA: Diagnosis not present

## 2017-07-12 DIAGNOSIS — K760 Fatty (change of) liver, not elsewhere classified: Secondary | ICD-10-CM | POA: Diagnosis not present

## 2017-07-12 DIAGNOSIS — R188 Other ascites: Secondary | ICD-10-CM | POA: Diagnosis not present

## 2017-07-12 DIAGNOSIS — K766 Portal hypertension: Secondary | ICD-10-CM | POA: Diagnosis not present

## 2017-07-12 DIAGNOSIS — F5101 Primary insomnia: Secondary | ICD-10-CM | POA: Diagnosis not present

## 2017-07-12 DIAGNOSIS — E291 Testicular hypofunction: Secondary | ICD-10-CM | POA: Diagnosis not present

## 2017-07-12 DIAGNOSIS — E1121 Type 2 diabetes mellitus with diabetic nephropathy: Secondary | ICD-10-CM | POA: Diagnosis not present

## 2017-07-14 DIAGNOSIS — F329 Major depressive disorder, single episode, unspecified: Secondary | ICD-10-CM | POA: Diagnosis not present

## 2017-07-14 DIAGNOSIS — D509 Iron deficiency anemia, unspecified: Secondary | ICD-10-CM | POA: Diagnosis not present

## 2017-07-14 DIAGNOSIS — E1165 Type 2 diabetes mellitus with hyperglycemia: Secondary | ICD-10-CM | POA: Diagnosis not present

## 2017-07-14 DIAGNOSIS — E1122 Type 2 diabetes mellitus with diabetic chronic kidney disease: Secondary | ICD-10-CM | POA: Diagnosis not present

## 2017-07-14 DIAGNOSIS — E1121 Type 2 diabetes mellitus with diabetic nephropathy: Secondary | ICD-10-CM | POA: Diagnosis not present

## 2017-07-14 DIAGNOSIS — E782 Mixed hyperlipidemia: Secondary | ICD-10-CM | POA: Diagnosis not present

## 2017-07-14 DIAGNOSIS — M19049 Primary osteoarthritis, unspecified hand: Secondary | ICD-10-CM | POA: Diagnosis not present

## 2017-07-14 DIAGNOSIS — E1140 Type 2 diabetes mellitus with diabetic neuropathy, unspecified: Secondary | ICD-10-CM | POA: Diagnosis not present

## 2017-07-14 DIAGNOSIS — D649 Anemia, unspecified: Secondary | ICD-10-CM | POA: Diagnosis not present

## 2017-07-14 DIAGNOSIS — N183 Chronic kidney disease, stage 3 (moderate): Secondary | ICD-10-CM | POA: Diagnosis not present

## 2017-07-14 DIAGNOSIS — I10 Essential (primary) hypertension: Secondary | ICD-10-CM | POA: Diagnosis not present

## 2017-07-14 DIAGNOSIS — Z794 Long term (current) use of insulin: Secondary | ICD-10-CM | POA: Diagnosis not present

## 2017-07-17 DIAGNOSIS — K7460 Unspecified cirrhosis of liver: Secondary | ICD-10-CM | POA: Diagnosis not present

## 2017-07-17 DIAGNOSIS — D649 Anemia, unspecified: Secondary | ICD-10-CM | POA: Diagnosis not present

## 2017-07-24 ENCOUNTER — Ambulatory Visit: Payer: Medicare Other | Admitting: Oncology

## 2017-07-25 ENCOUNTER — Ambulatory Visit (INDEPENDENT_AMBULATORY_CARE_PROVIDER_SITE_OTHER): Payer: Medicare Other | Admitting: Family

## 2017-07-25 ENCOUNTER — Encounter: Payer: Self-pay | Admitting: Family

## 2017-07-25 ENCOUNTER — Ambulatory Visit (HOSPITAL_COMMUNITY)
Admission: RE | Admit: 2017-07-25 | Discharge: 2017-07-25 | Disposition: A | Discharge status: Home / Self Care | Payer: Medicare Other | Source: Ambulatory Visit | Attending: Family | Admitting: Family

## 2017-07-25 VITALS — BP 112/59 | HR 70 | Temp 97.1°F | Resp 18 | Wt 174.8 lb

## 2017-07-25 DIAGNOSIS — Z9889 Other specified postprocedural states: Secondary | ICD-10-CM

## 2017-07-25 DIAGNOSIS — I6529 Occlusion and stenosis of unspecified carotid artery: Secondary | ICD-10-CM

## 2017-07-25 DIAGNOSIS — I6523 Occlusion and stenosis of bilateral carotid arteries: Secondary | ICD-10-CM | POA: Insufficient documentation

## 2017-07-25 LAB — VAS US CAROTID
LCCADDIAS: 12 cm/s
LCCADSYS: 65 cm/s
LEFT ECA DIAS: -95 cm/s
LEFT VERTEBRAL DIAS: -9 cm/s
LICADDIAS: -19 cm/s
LICADSYS: -112 cm/s
LICAPDIAS: -22 cm/s
LICAPSYS: -115 cm/s
Left CCA prox dias: 9 cm/s
Left CCA prox sys: 89 cm/s
RIGHT CCA MID DIAS: 14 cm/s
RIGHT ECA DIAS: -8 cm/s
RIGHT VERTEBRAL DIAS: -14 cm/s
Right CCA prox dias: 10 cm/s
Right CCA prox sys: 84 cm/s
Right cca dist sys: -141 cm/s

## 2017-07-25 NOTE — Patient Instructions (Signed)

## 2017-07-25 NOTE — Progress Notes (Signed)
Chief Complaint: Follow up Extracranial Carotid Artery Stenosis   History of Present Illness  Javier Potter is a 71 y.o. male who is s/p right CEA on 06-22-16 by Dr. Donnetta Hutching for right visual changes. He reports occasional floaters in his left eye but has had no episodes of amaurosis.    Dr. Donnetta Hutching last evaluated pt on 01-17-17. At that time carotid duplex showed widely patent endarterectomy with no evidence of recurrent stenosis. Left internal carotid artery was widely patent as well. He did have a high-grade external carotid stenosis. We will see him again in 6 months and assuming no change within drop back to yearly surveillance. Dr. Donnetta Hutching explained that on physical exam there is a harsh left carotid bruit which is related to his left external carotid stenosis which has no clinical significance. Return in 6 months.  He was walking an hour daily, and exercising in a gym 3 days/week, but unable to exercise at all since June 2018. He denies claudication symptoms with walking.   He was diagnosed with cirrhosis in 2016, which has been stable, but pt developed severe fatigue in June 2018, states he was given 2-3 years to live, feels drained all the time, has had extensive evaluation of this. He has become anemic.   Pt Diabetic: yes, states his A1C was 6.8, but states his sugars are running very high lately, about 250 Pt smoker: former smoker, quit in 2000, was up to 3 ppd, smoked for about 40 years  Pt meds include:  Statin : no ASA: yes Other anticoagulants/antiplatelets: no    Past Medical History:  Diagnosis Date  . Anemia    hx  . Arthritis    "hands" (06/22/2016)  . Carotid stenosis, right   . CHF (congestive heart failure) ([place])   . Chronic kidney disease (CKD), stage III (moderate) ([facility])   . Cirrhosis, cryptogenic ([place]) 07/04/2017  . GERD (gastroesophageal reflux disease)    under control  . High cholesterol   . Hypertension   . Murmur    had since childhood-no problems   . Pancytopenia ([place]) 07/04/2017  . Sleep apnea    "can't tolerate mask" (06/22/2016)  . Type II diabetes mellitus ([place])     Social History Social History   Tobacco Use  . Smoking status: Former Smoker    Packs/day: 3.00    Years: 40.00    Pack years: 120.00    Types: Cigarettes    Last attempt to quit: 06/18/1999    Years since quitting: 18.1  . Smokeless tobacco: Never Used  Substance Use Topics  . Alcohol use: Yes  . Drug use: No    Family History No family history on file.  Surgical History Past Surgical History:  Procedure Laterality Date  . CAROTID ENDARTERECTOMY Right 06/22/2016  . CATARACT EXTRACTION W/ INTRAOCULAR LENS  IMPLANT, BILATERAL Bilateral 09-2015, 11-2015  . CHOLECYSTECTOMY N/A 11/14/2014   Procedure: LAPAROSCOPIC CHOLECYSTECTOMY WITH INTRAOPERATIVE CHOLANGIOGRAM;  Surgeon: Johnathan Hausen, MD;  Location: [facility];  Service: General;  Laterality: N/A;  . COLONOSCOPY WITH PROPOFOL N/A 07/28/2014   Procedure: COLONOSCOPY WITH PROPOFOL;  Surgeon: Garlan Fair, MD;  Location: [facility];  Service: Endoscopy;  Laterality: N/A;  . ENDARTERECTOMY Right 06/22/2016   Procedure: Right Carotid ENDARTERECTOMY;  Surgeon: Rosetta Posner, MD;  Location: [place] [facility];  Service: Vascular;  Laterality: Right;  . ESOPHAGOGASTRODUODENOSCOPY (EGD) WITH PROPOFOL N/A 07/28/2014   Procedure: ESOPHAGOGASTRODUODENOSCOPY (EGD) WITH PROPOFOL;  Surgeon: Garlan Fair, MD;  Location: [facility];  Service: Endoscopy;  Laterality: N/A;  . NASAL SINUS SURGERY  ~ 2000  . PATCH ANGIOPLASTY  06/22/2016   Procedure: PATCH ANGIOPLASTY;  Surgeon: Rosetta Posner, MD;  Location: [facility];  Service: Vascular;;  . TONSILLECTOMY  age 89  . [place] EXTRACTION  1963?    Allergies  Allergen Reactions  . Augmentin [Amoxicillin-Pot Clavulanate] Swelling and Other (See Comments)    Tongue swells Patient states has tolerated amoxicillin  . Sulfa Antibiotics Swelling and Other (See Comments)    Tongue  swells  . Ambien [Zolpidem Tartrate] Other (See Comments)    "did not sit well with system..made me go crazy"    Current Outpatient Medications  Medication Sig Dispense Refill  . aspirin EC 81 MG tablet Take 81 mg by mouth at bedtime.    . Cholecalciferol (VITAMIN D) 2000 UNITS CAPS Take 2,000 Units by mouth every morning.     . cyanocobalamin (,VITAMIN B-12,) 1000 MCG/ML injection Inject 1,000 mcg into the muscle every 30 (thirty) days.    . Ferrous Sulfate (IRON) 325 (65 FE) MG TABS Take 325 mg by mouth 2 (two) times daily.     . fluticasone (FLONASE) 50 MCG/ACT nasal spray Place 1 spray into both nostrils 2 (two) times daily as needed for allergies or rhinitis.   2  . furosemide (LASIX) 20 MG tablet Take 20 mg by mouth daily.  5  . glucosamine-chondroitin 500-400 MG tablet Take 2 tablets by mouth daily.    . insulin aspart (NOVOLOG) 100 UNIT/ML injection Inject 0-40 Units into the skin 3 (three) times daily with meals. Per sliding scale    . LANTUS SOLOSTAR 100 UNIT/ML Solostar Pen INJECT 30 UNITS WITH EACH MEAL 3 TIMES A DAY  3  . lisinopril (PRINIVIL,ZESTRIL) 20 MG tablet Take 20 mg by mouth at bedtime.    . metoprolol succinate (TOPROL-XL) 50 MG 24 hr tablet Take 50 mg by mouth every morning. Take with or immediately following a meal.    . NOVOLOG FLEXPEN 100 UNIT/ML FlexPen INJECT 30 TO 60 UNITS UNDER THE SKIN TWICE DAILY WITH MEALS  1  . omeprazole (PRILOSEC) 20 MG capsule Take 20 mg by mouth 2 (two) times daily before a meal.     . psyllium (METAMUCIL) 58.6 % powder Take 1 packet by mouth daily.     Marland Kitchen SHINGRIX injection TO BE ADMINISTERED BY PHARMACIST FOR IMMUNIZATION  0  . sildenafil (REVATIO) 20 MG tablet Take 20 mg by mouth as needed (for ED).     Marland Kitchen spironolactone (ALDACTONE) 50 MG tablet Take 50 mg by mouth 2 (two) times daily with a meal.  5  . temazepam (RESTORIL) 15 MG capsule Take 15 mg by mouth at bedtime as needed. for sleep  1   No current facility-administered  medications for this visit.     Review of Systems : See HPI for pertinent positives and negatives.  Physical Examination  Vitals:   07/25/17 1535 07/25/17 1538  BP: 113/65 (!) 112/59  Pulse: 70   Resp: 18   Temp: (!) 97.1 F (36.2 C)   TempSrc: Oral   SpO2: 99%   Weight: 174 lb 12.8 oz (79.3 kg)    Body mass index is 28.21 kg/m.  General: WDWN malein NAD GAIT:normal Eyes: PERRLA, pale conjunctiva, no jaundice.  Pulmonary: Respirations are non-labored, CTAB, good air movement in all fields. Cardiac: regular rhythm and rate, + murmur.  VASCULAR EXAM Carotid Bruits Right Left   transmitted cardiac murmur transmitted  cardiac murmur    Abdominal aortic puls is notpalpable. Radial pulses are 2+palpable and equal.   LE Pulses Right Left  POPLITEAL notpalpable notpalpable  POSTERIOR TIBIAL palpable palpable  DORSALIS PEDIS ANTERIOR TIBIAL palpable palpable     Gastrointestinal: soft, nontender, BS WNL, no r/g, large asymptomatic reducible ventral hernia.  Musculoskeletal: nomuscle atrophy/wasting. M/S 5/5 throughout, extremities without ischemic changes.  Skin: No jaundice, no rashes, no cellulitis, no ulcers.   Neurologic: A&O X 3; appropriate affect, speech is normal, CN 2-12 intact, pain and light touch intact in extremities, Motor exam as listed above.   Assessment: Javier Potter is a 71 y.o. male who is s/p right CEA on 06-22-16 by Dr. Donnetta Hutching for right visual changes. He reports occasional floaters in his left eye but has had no episodes of amaurosis.   Pt was given about 2-3 years to live, has cirrhosis, has anemia, severe fatigue since June 2018.  Since he has no subsequent amaurosis nor any neurological events, and his extracranial carotid artery stenosis is <40%, pt does not need to follow up with [place] unless he is doing well in a couple of years, we can resume monitoring his extracranial  carotid stenosis in that case.   DATA Carotid Duplex (07/25/17): Right ICA: CEA site with mild hyperplasia throughout the surgical site,<40%. Left ICA: <40% stenosis. Left ECA stenosis. Bilateral vertebral artery flow is antegrade.  Bilateral subclavian artery waveforms are normal.  No change since exam on 01-17-17.    Plan: Follow-up as needed.   I discussed in depth with the patient the nature of atherosclerosis, and emphasized the importance of maximal medical management including strict control of blood pressure, blood glucose, and lipid levels, obtaining regular exercise, and continued cessation of smoking.  The patient is aware that without maximal medical management the underlying atherosclerotic disease process will progress, limiting the benefit of any interventions. The patient was given information about stroke prevention and what symptoms should prompt the patient to seek immediate medical care. Thank you for allowing [place] to participate in this patient's care.  Clemon Chambers, RN, MSN, FNP-C [facility] Office: [phone]  Clinic Physician: Early  07/25/17 4:05 PM

## 2017-07-31 ENCOUNTER — Ambulatory Visit: Payer: Medicare Other | Admitting: Oncology

## 2017-08-03 DIAGNOSIS — Z125 Encounter for screening for malignant neoplasm of prostate: Secondary | ICD-10-CM | POA: Diagnosis not present

## 2017-08-03 DIAGNOSIS — Z5181 Encounter for therapeutic drug level monitoring: Secondary | ICD-10-CM | POA: Diagnosis not present

## 2017-08-03 DIAGNOSIS — E1122 Type 2 diabetes mellitus with diabetic chronic kidney disease: Secondary | ICD-10-CM | POA: Diagnosis not present

## 2017-08-03 DIAGNOSIS — E291 Testicular hypofunction: Secondary | ICD-10-CM | POA: Diagnosis not present

## 2017-08-03 DIAGNOSIS — K7460 Unspecified cirrhosis of liver: Secondary | ICD-10-CM | POA: Diagnosis not present

## 2017-08-03 DIAGNOSIS — N183 Chronic kidney disease, stage 3 (moderate): Secondary | ICD-10-CM | POA: Diagnosis not present

## 2017-08-03 DIAGNOSIS — Z794 Long term (current) use of insulin: Secondary | ICD-10-CM | POA: Diagnosis not present

## 2017-08-07 ENCOUNTER — Ambulatory Visit: Payer: Medicare Other | Admitting: Oncology

## 2017-08-15 DIAGNOSIS — E291 Testicular hypofunction: Secondary | ICD-10-CM | POA: Diagnosis not present

## 2017-08-15 DIAGNOSIS — K760 Fatty (change of) liver, not elsewhere classified: Secondary | ICD-10-CM | POA: Diagnosis not present

## 2017-08-15 DIAGNOSIS — E1121 Type 2 diabetes mellitus with diabetic nephropathy: Secondary | ICD-10-CM | POA: Diagnosis not present

## 2017-08-15 DIAGNOSIS — Z794 Long term (current) use of insulin: Secondary | ICD-10-CM | POA: Diagnosis not present

## 2017-08-15 DIAGNOSIS — K766 Portal hypertension: Secondary | ICD-10-CM | POA: Diagnosis not present

## 2017-08-15 DIAGNOSIS — E538 Deficiency of other specified B group vitamins: Secondary | ICD-10-CM | POA: Diagnosis not present

## 2017-08-15 DIAGNOSIS — N183 Chronic kidney disease, stage 3 (moderate): Secondary | ICD-10-CM | POA: Diagnosis not present

## 2017-08-15 DIAGNOSIS — D509 Iron deficiency anemia, unspecified: Secondary | ICD-10-CM | POA: Diagnosis not present

## 2017-08-15 DIAGNOSIS — R188 Other ascites: Secondary | ICD-10-CM | POA: Diagnosis not present

## 2017-08-15 DIAGNOSIS — K7460 Unspecified cirrhosis of liver: Secondary | ICD-10-CM | POA: Diagnosis not present

## 2017-09-05 DIAGNOSIS — D649 Anemia, unspecified: Secondary | ICD-10-CM | POA: Diagnosis not present

## 2017-09-07 ENCOUNTER — Telehealth: Payer: Self-pay | Admitting: *Deleted

## 2017-09-07 NOTE — Telephone Encounter (Signed)
Call from [place] at Dr Erlinda Hong office (GI). Labs done 09/05/17 Hgb- 7.6. Requesting IV infusion.  Talked to Dr Beryle Beams - he will view lab results (placed in his mailbox).

## 2017-09-11 ENCOUNTER — Other Ambulatory Visit: Payer: Self-pay | Admitting: Oncology

## 2017-09-11 DIAGNOSIS — K7469 Other cirrhosis of liver: Secondary | ICD-10-CM

## 2017-09-11 DIAGNOSIS — D61818 Other pancytopenia: Secondary | ICD-10-CM

## 2017-09-11 LAB — PROTIME-INR

## 2017-09-12 ENCOUNTER — Other Ambulatory Visit: Payer: Self-pay | Admitting: Oncology

## 2017-09-12 DIAGNOSIS — D61818 Other pancytopenia: Secondary | ICD-10-CM

## 2017-09-13 ENCOUNTER — Other Ambulatory Visit: Payer: Self-pay | Admitting: Oncology

## 2017-09-13 DIAGNOSIS — D61818 Other pancytopenia: Secondary | ICD-10-CM

## 2017-09-13 DIAGNOSIS — K7469 Other cirrhosis of liver: Secondary | ICD-10-CM

## 2017-09-18 DIAGNOSIS — G4700 Insomnia, unspecified: Secondary | ICD-10-CM | POA: Diagnosis not present

## 2017-09-18 DIAGNOSIS — E1121 Type 2 diabetes mellitus with diabetic nephropathy: Secondary | ICD-10-CM | POA: Diagnosis not present

## 2017-09-18 DIAGNOSIS — D509 Iron deficiency anemia, unspecified: Secondary | ICD-10-CM | POA: Diagnosis not present

## 2017-09-18 DIAGNOSIS — K7460 Unspecified cirrhosis of liver: Secondary | ICD-10-CM | POA: Diagnosis not present

## 2017-09-18 DIAGNOSIS — Z794 Long term (current) use of insulin: Secondary | ICD-10-CM | POA: Diagnosis not present

## 2017-09-18 DIAGNOSIS — J309 Allergic rhinitis, unspecified: Secondary | ICD-10-CM | POA: Diagnosis not present

## 2017-09-18 DIAGNOSIS — K766 Portal hypertension: Secondary | ICD-10-CM | POA: Diagnosis not present

## 2017-09-18 DIAGNOSIS — E291 Testicular hypofunction: Secondary | ICD-10-CM | POA: Diagnosis not present

## 2017-09-18 DIAGNOSIS — K760 Fatty (change of) liver, not elsewhere classified: Secondary | ICD-10-CM | POA: Diagnosis not present

## 2017-09-18 DIAGNOSIS — R188 Other ascites: Secondary | ICD-10-CM | POA: Diagnosis not present

## 2017-09-18 DIAGNOSIS — N183 Chronic kidney disease, stage 3 (moderate): Secondary | ICD-10-CM | POA: Diagnosis not present

## 2017-09-18 DIAGNOSIS — E538 Deficiency of other specified B group vitamins: Secondary | ICD-10-CM | POA: Diagnosis not present

## 2017-09-19 ENCOUNTER — Other Ambulatory Visit: Payer: Self-pay | Admitting: Radiology

## 2017-09-20 ENCOUNTER — Ambulatory Visit (HOSPITAL_COMMUNITY)
Admission: RE | Admit: 2017-09-20 | Discharge: 2017-09-20 | Disposition: A | Discharge status: Home / Self Care | Payer: Medicare Other | Source: Ambulatory Visit | Attending: Oncology | Admitting: Oncology

## 2017-09-20 ENCOUNTER — Encounter (HOSPITAL_COMMUNITY): Payer: Self-pay

## 2017-09-20 DIAGNOSIS — N183 Chronic kidney disease, stage 3 (moderate): Secondary | ICD-10-CM | POA: Insufficient documentation

## 2017-09-20 DIAGNOSIS — E7800 Pure hypercholesterolemia, unspecified: Secondary | ICD-10-CM | POA: Insufficient documentation

## 2017-09-20 DIAGNOSIS — D7589 Other specified diseases of blood and blood-forming organs: Secondary | ICD-10-CM | POA: Diagnosis not present

## 2017-09-20 DIAGNOSIS — K219 Gastro-esophageal reflux disease without esophagitis: Secondary | ICD-10-CM | POA: Diagnosis not present

## 2017-09-20 DIAGNOSIS — K7469 Other cirrhosis of liver: Secondary | ICD-10-CM | POA: Insufficient documentation

## 2017-09-20 DIAGNOSIS — I509 Heart failure, unspecified: Secondary | ICD-10-CM | POA: Diagnosis not present

## 2017-09-20 DIAGNOSIS — E1122 Type 2 diabetes mellitus with diabetic chronic kidney disease: Secondary | ICD-10-CM | POA: Insufficient documentation

## 2017-09-20 DIAGNOSIS — Z79899 Other long term (current) drug therapy: Secondary | ICD-10-CM | POA: Insufficient documentation

## 2017-09-20 DIAGNOSIS — Z794 Long term (current) use of insulin: Secondary | ICD-10-CM | POA: Diagnosis not present

## 2017-09-20 DIAGNOSIS — D61818 Other pancytopenia: Secondary | ICD-10-CM | POA: Insufficient documentation

## 2017-09-20 DIAGNOSIS — Z7982 Long term (current) use of aspirin: Secondary | ICD-10-CM | POA: Insufficient documentation

## 2017-09-20 DIAGNOSIS — I130 Hypertensive heart and chronic kidney disease with heart failure and stage 1 through stage 4 chronic kidney disease, or unspecified chronic kidney disease: Secondary | ICD-10-CM | POA: Diagnosis not present

## 2017-09-20 LAB — CBC WITH DIFFERENTIAL/PLATELET
BASOS ABS: 0 10*3/uL (ref 0.0–0.1)
Basophils Relative: 0 %
Eosinophils Absolute: 0.1 10*3/uL (ref 0.0–0.7)
Eosinophils Relative: 2 %
HCT: 22.8 % — ABNORMAL LOW (ref 39.0–52.0)
Hemoglobin: 7.5 g/dL — ABNORMAL LOW (ref 13.0–17.0)
LYMPHS PCT: 21 %
Lymphs Abs: 0.5 10*3/uL — ABNORMAL LOW (ref 0.7–4.0)
MCH: 30.9 pg (ref 26.0–34.0)
MCHC: 32.9 g/dL (ref 30.0–36.0)
MCV: 93.8 fL (ref 78.0–100.0)
MONO ABS: 0.3 10*3/uL (ref 0.1–1.0)
MONOS PCT: 12 %
NEUTROS ABS: 1.5 10*3/uL — AB (ref 1.7–7.7)
Neutrophils Relative %: 65 %
Platelets: 70 10*3/uL — ABNORMAL LOW (ref 150–400)
RBC: 2.43 MIL/uL — ABNORMAL LOW (ref 4.22–5.81)
RDW: 16.3 % — AB (ref 11.5–15.5)
WBC: 2.4 10*3/uL — ABNORMAL LOW (ref 4.0–10.5)

## 2017-09-20 LAB — BASIC METABOLIC PANEL
ANION GAP: 9 (ref 5–15)
BUN: 48 mg/dL — ABNORMAL HIGH (ref 6–20)
CO2: 24 mmol/L (ref 22–32)
Calcium: 9.5 mg/dL (ref 8.9–10.3)
Chloride: 103 mmol/L (ref 101–111)
Creatinine, Ser: 1.32 mg/dL — ABNORMAL HIGH (ref 0.61–1.24)
GFR calc Af Amer: >60 mL/min (ref >60)
GFR calc non Af Amer: 53 mL/min — ABNORMAL LOW (ref >60)
GLUCOSE: 272 mg/dL — AB (ref 65–99)
POTASSIUM: 4.7 mmol/L (ref 3.5–5.1)
Sodium: 136 mmol/L (ref 135–145)

## 2017-09-20 LAB — GLUCOSE, CAPILLARY: GLUCOSE-CAPILLARY: 260 mg/dL — AB (ref 65–99)

## 2017-09-20 LAB — PROTIME-INR
INR: 1.11
Prothrombin Time: 14.2 seconds (ref 11.4–15.2)

## 2017-09-20 MED ORDER — HYDROCODONE-ACETAMINOPHEN 5-325 MG PO TABS: 1.0000 | ORAL_TABLET | ORAL | Status: DC | PRN

## 2017-09-20 MED ORDER — LIDOCAINE HCL (PF) 1 % IJ SOLN
INTRAMUSCULAR | Status: AC | PRN
  Administered 2017-09-20: 20 mL

## 2017-09-20 MED ORDER — SODIUM CHLORIDE 0.9 % IV SOLN
INTRAVENOUS | Status: DC
Start: 2017-09-20 — End: 2017-09-21
  Administered 2017-09-20: 07:00:00 via INTRAVENOUS

## 2017-09-20 MED ORDER — FENTANYL CITRATE (PF) 100 MCG/2ML IJ SOLN
INTRAMUSCULAR | Status: AC | PRN
  Administered 2017-09-20 (×2): 50 ug via INTRAVENOUS

## 2017-09-20 MED ORDER — FENTANYL CITRATE (PF) 100 MCG/2ML IJ SOLN
INTRAMUSCULAR | Status: AC
  Filled 2017-09-20: qty 4

## 2017-09-20 NOTE — Procedures (Signed)
CT guided bone marrow biopsy.  2 aspirates and 1 core.  Minimal blood loss and no immediate complication.   

## 2017-09-20 NOTE — Discharge Instructions (Signed)
You may take dressing off and bathe in 24 hours.  Needle Biopsy of the Bone, Care After Refer to this sheet in the next few weeks. These instructions provide you with information about caring for yourself after your procedure. Your health care provider may also give you more specific instructions. Your treatment has been planned according to current medical practices, but problems sometimes occur. Call your health care provider if you have any problems or questions after your procedure. What can I expect after the procedure? After your procedure, it is common to have soreness or tenderness at the puncture site. Follow these instructions at home:  Take over-the-counter and prescription medicines only as told by your health care provider.  Bathe and shower as told by your health care provider.  Follow instructions from your health care provider about: ? How to take care of your puncture site. ? When and how you should change your bandage (dressing). ? When you should remove your dressing.  Check your puncture site every day for signs of infection. Watch for: ? Redness, swelling, or worsening pain. ? Fluid, blood, or pus.  Return to your normal activities as told by your health care provider.  Keep all follow-up visits as told by your health care provider. This is important. Contact a health care provider if:  You have redness, swelling, or worsening pain at the site of your puncture.  You have fluid, blood, or pus coming from your puncture site.  You have a fever.  You have persistent nausea or vomiting. Get help right away if:  You develop a rash.  You have difficulty breathing. This information is not intended to replace advice given to you by your health care provider. Make sure you discuss any questions you have with your health care provider. Document Released: 03/04/2005 Document Revised: 01/21/2016 Document Reviewed: 09/22/2014 Elsevier Interactive Patient Education   [employer].

## 2017-09-20 NOTE — Consult Note (Signed)
Chief Complaint: Patient was seen in consultation today for CT-guided bone marrow biopsy  Referring Physician(s): Rosman M  Supervising Physician: Markus Daft  Patient Status: [facility] - Out-pt  History of Present Illness: Javier Potter is a 72 y.o. 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  Past Medical History:  Diagnosis Date  . Anemia    hx  . Arthritis    "hands" (06/22/2016)  . Carotid stenosis, right   . CHF (congestive heart failure) ([place])   . Chronic kidney disease (CKD), stage III (moderate) (HCC)   . Cirrhosis, cryptogenic ([place]) 07/04/2017  . GERD (gastroesophageal reflux disease)    under control  . High cholesterol   . Hypertension   . Murmur    had since childhood-no problems  . Pancytopenia ([place]) 07/04/2017  . Sleep apnea    "can't tolerate mask" (06/22/2016)  . Type II diabetes mellitus ([place])     Past Surgical History:  Procedure Laterality Date  . CAROTID ENDARTERECTOMY Right 06/22/2016  . CATARACT EXTRACTION W/ INTRAOCULAR LENS  IMPLANT, BILATERAL Bilateral 09-2015, 11-2015  . CHOLECYSTECTOMY N/A 11/14/2014   Procedure: LAPAROSCOPIC CHOLECYSTECTOMY WITH INTRAOPERATIVE CHOLANGIOGRAM;  Surgeon: Johnathan Hausen, MD;  [facility];  Service: General;  Laterality: N/A;  . COLONOSCOPY WITH PROPOFOL N/A 07/28/2014   Procedure: COLONOSCOPY WITH PROPOFOL;  Surgeon: Garlan Fair, MD;  [facility];  Service: Endoscopy;  Laterality: N/A;  . ENDARTERECTOMY Right 06/22/2016   Procedure: Right Carotid ENDARTERECTOMY;  Surgeon: Rosetta Posner, MD;  Location: [place];  Service: Vascular;  Laterality: Right;  . ESOPHAGOGASTRODUODENOSCOPY (EGD) WITH PROPOFOL N/A 07/28/2014   Procedure: ESOPHAGOGASTRODUODENOSCOPY (EGD) WITH PROPOFOL;  Surgeon: Garlan Fair, MD;  [facility];  Service:  Endoscopy;  Laterality: N/A;  . NASAL SINUS SURGERY  ~ 2000  . PATCH ANGIOPLASTY  06/22/2016   Procedure: PATCH ANGIOPLASTY;  Surgeon: Rosetta Posner, MD;  Location: [place];  Service: Vascular;;  . TONSILLECTOMY  age 32  . [place] EXTRACTION  1963?    Allergies: Augmentin [amoxicillin-pot clavulanate]; Sulfa antibiotics; and Ambien [zolpidem tartrate]  Medications: Prior to Admission medications   Medication Sig Start Date End Date Taking? Authorizing Provider  aspirin EC 81 MG tablet Take 81 mg by mouth at bedtime.   Yes [provider]  Cholecalciferol (VITAMIN D) 2000 UNITS CAPS Take 2,000 Units by mouth every morning.    Yes [provider]  cyanocobalamin (,VITAMIN B-12,) 1000 MCG/ML injection Inject 1,000 mcg into the muscle every 30 (thirty) days.   Yes [provider]  Ferrous Sulfate (IRON) 325 (65 FE) MG TABS Take 325 mg by mouth 2 (two) times daily.    Yes [provider]  fluticasone (FLONASE) 50 MCG/ACT nasal spray Place 1 spray into both nostrils 2 (two) times daily as needed for allergies or rhinitis.  06/14/17  Yes [provider]  furosemide (LASIX) 20 MG tablet Take 20 mg by mouth daily. 06/08/17  Yes [provider]  glucosamine-chondroitin 500-400 MG tablet Take 2 tablets by mouth daily.   Yes [provider]  insulin aspart (NOVOLOG) 100 UNIT/ML injection Inject 0-40 Units into the skin 3 (three) times daily with meals. Per sliding scale   Yes [provider]  lisinopril (PRINIVIL,ZESTRIL) 20 MG tablet Take 20 mg by mouth at bedtime.   Yes [provider]  metoprolol succinate (TOPROL-XL) 50 MG  24 hr tablet Take 50 mg by mouth every morning. Take with or immediately following a meal.   Yes [provider]  NOVOLOG FLEXPEN 100 UNIT/ML FlexPen INJECT 30 TO 60 UNITS UNDER THE SKIN TWICE DAILY WITH MEALS 07/06/17  Yes [provider]  omeprazole (PRILOSEC) 20 MG capsule Take  20 mg by mouth 2 (two) times daily before a meal.    Yes [provider]  psyllium (METAMUCIL) 58.6 % powder Take 1 packet by mouth daily.    Yes [provider]  sildenafil (REVATIO) 20 MG tablet Take 20 mg by mouth as needed (for ED).  04/18/16  Yes [provider]  spironolactone (ALDACTONE) 50 MG tablet Take 50 mg by mouth 2 (two) times daily with a meal. 06/08/17  Yes [provider]  temazepam (RESTORIL) 15 MG capsule Take 15 mg by mouth at bedtime as needed. for sleep 07/06/17  Yes [provider]  LANTUS SOLOSTAR 100 UNIT/ML Solostar Pen INJECT 30 UNITS WITH EACH MEAL 3 TIMES A DAY 04/30/17   [provider]  [facility] injection TO BE ADMINISTERED BY PHARMACIST FOR IMMUNIZATION 06/06/17   [provider]     History reviewed. No pertinent family history.  Social History   Socioeconomic History  . Marital status: Married    Spouse name: None  . Number of children: None  . Years of education: None  . Highest education level: None  Social Needs  . Financial resource strain: None  . Food insecurity - worry: None  . Food insecurity - inability: None  . Transportation needs - medical: None  . Transportation needs - non-medical: None  Occupational History  . None  Tobacco Use  . Smoking status: Former Smoker    Packs/day: 3.00    Years: 40.00    Pack years: 120.00    Types: Cigarettes    Last attempt to quit: 06/18/1999    Years since quitting: 18.2  . Smokeless tobacco: Never Used  Substance and Sexual Activity  . Alcohol use: Yes  . Drug use: No  . Sexual activity: Yes  Other Topics Concern  . None  Social History Narrative  . None      Review of Systems currently denies fever, headache, chest pain, dyspnea, cough, abdominal/back pain, nausea, vomiting or bleeding.  He does have fatigue.  Vital Signs: BP 135/60   Pulse 69   Temp 98 F (36.7 C) (Oral)   Resp 16   Ht 5' 6"  (1.676 m)   Wt 187 lb (84.8  kg)   SpO2 98%   BMI 30.18 kg/m   Physical Exam.  Chest clear to auscultation bilaterally.  Heart with regular rate and rhythm, positive murmur.  Abdomen protuberant, positive bowel sounds, nontender.  No lower extremity edema.  Imaging: No results found.  Labs:  CBC: Recent Labs    06/13/17 1713 06/18/17 1050 07/04/17 1608 09/20/17 0708  WBC 3.3* 4.5 3.4* 2.4*  HGB 7.9* 9.1* 10.6* 7.5*  HCT 26.8* 31.7* 34.1* 22.8*  PLT 62* 63* 89* 70*    COAGS: Recent Labs    09/20/17 0708  INR 1.11    BMP: Recent Labs    06/13/17 1713 06/18/17 1050 07/04/17 1610 09/20/17 0708  NA 137 136 142 136  K 4.1 4.5 4.4 4.7  CL 105 105 103 103  CO2 24 23 23 24   GLUCOSE 194* 140* 130* 272*  BUN 17 17 33* 48*  CALCIUM 9.8 9.4 10.2 9.5  CREATININE 1.14 1.20  1.32* 1.32*  GFRNONAA >60 60* 54* 53*  GFRAA >60 >60 63 >60    LIVER FUNCTION TESTS: Recent Labs    06/13/17 1920 06/18/17 1050 07/04/17 1610  BILITOT 0.5 0.9 0.4  AST 23 31 26   ALT 20 25 32  ALKPHOS 83 85 106  PROT 6.6 7.1 6.9  ALBUMIN 3.9 4.1 4.4    TUMOR MARKERS: No results for input(s): AFPTM, CEA, CA199, CHROMGRNA in the last 8760 hours.  Assessment and Plan: 72 y.o. 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  Patient had coffee with cream and sugar this morning at 0530 therefore he will receive IV fentanyl only and local anesthesia during procedure.     Thank you for this interesting consult.  I greatly enjoyed meeting DEMICO PLOCH and look forward to participating in their care.  A copy  of this report was sent to the requesting provider on this date.  Electronically Signed: D. Rowe Robert, PA-C 09/20/2017, 8:21 AM   I spent a total of 20 minutes  in face to face in clinical consultation, greater than 50% of which was counseling/coordinating care for CT-guided bone marrow biopsy

## 2017-09-28 DIAGNOSIS — R935 Abnormal findings on diagnostic imaging of other abdominal regions, including retroperitoneum: Secondary | ICD-10-CM | POA: Diagnosis not present

## 2017-09-28 DIAGNOSIS — K7460 Unspecified cirrhosis of liver: Secondary | ICD-10-CM | POA: Diagnosis not present

## 2017-09-28 DIAGNOSIS — D649 Anemia, unspecified: Secondary | ICD-10-CM | POA: Diagnosis not present

## 2017-09-29 ENCOUNTER — Other Ambulatory Visit: Payer: Self-pay | Admitting: Gastroenterology

## 2017-09-29 DIAGNOSIS — K7469 Other cirrhosis of liver: Secondary | ICD-10-CM

## 2017-10-04 DIAGNOSIS — K7460 Unspecified cirrhosis of liver: Secondary | ICD-10-CM | POA: Diagnosis not present

## 2017-10-04 DIAGNOSIS — K766 Portal hypertension: Secondary | ICD-10-CM | POA: Diagnosis not present

## 2017-10-04 DIAGNOSIS — E1122 Type 2 diabetes mellitus with diabetic chronic kidney disease: Secondary | ICD-10-CM | POA: Diagnosis not present

## 2017-10-04 DIAGNOSIS — E1121 Type 2 diabetes mellitus with diabetic nephropathy: Secondary | ICD-10-CM | POA: Diagnosis not present

## 2017-10-04 DIAGNOSIS — D509 Iron deficiency anemia, unspecified: Secondary | ICD-10-CM | POA: Diagnosis not present

## 2017-10-04 DIAGNOSIS — N183 Chronic kidney disease, stage 3 (moderate): Secondary | ICD-10-CM | POA: Diagnosis not present

## 2017-10-04 DIAGNOSIS — D649 Anemia, unspecified: Secondary | ICD-10-CM | POA: Diagnosis not present

## 2017-10-04 DIAGNOSIS — E291 Testicular hypofunction: Secondary | ICD-10-CM | POA: Diagnosis not present

## 2017-10-04 DIAGNOSIS — E538 Deficiency of other specified B group vitamins: Secondary | ICD-10-CM | POA: Diagnosis not present

## 2017-10-04 DIAGNOSIS — R935 Abnormal findings on diagnostic imaging of other abdominal regions, including retroperitoneum: Secondary | ICD-10-CM | POA: Diagnosis not present

## 2017-10-04 DIAGNOSIS — K760 Fatty (change of) liver, not elsewhere classified: Secondary | ICD-10-CM | POA: Diagnosis not present

## 2017-10-05 DIAGNOSIS — E119 Type 2 diabetes mellitus without complications: Secondary | ICD-10-CM | POA: Diagnosis not present

## 2017-10-16 DIAGNOSIS — D509 Iron deficiency anemia, unspecified: Secondary | ICD-10-CM | POA: Diagnosis not present

## 2017-10-16 DIAGNOSIS — F329 Major depressive disorder, single episode, unspecified: Secondary | ICD-10-CM | POA: Diagnosis not present

## 2017-10-16 DIAGNOSIS — Z8249 Family history of ischemic heart disease and other diseases of the circulatory system: Secondary | ICD-10-CM | POA: Diagnosis not present

## 2017-10-16 DIAGNOSIS — Z881 Allergy status to other antibiotic agents status: Secondary | ICD-10-CM | POA: Diagnosis not present

## 2017-10-16 DIAGNOSIS — E785 Hyperlipidemia, unspecified: Secondary | ICD-10-CM | POA: Diagnosis not present

## 2017-10-16 DIAGNOSIS — K7290 Hepatic failure, unspecified without coma: Secondary | ICD-10-CM | POA: Diagnosis not present

## 2017-10-16 DIAGNOSIS — K7460 Unspecified cirrhosis of liver: Secondary | ICD-10-CM | POA: Diagnosis not present

## 2017-10-16 DIAGNOSIS — R609 Edema, unspecified: Secondary | ICD-10-CM | POA: Diagnosis not present

## 2017-10-16 DIAGNOSIS — Z801 Family history of malignant neoplasm of trachea, bronchus and lung: Secondary | ICD-10-CM | POA: Diagnosis not present

## 2017-10-16 DIAGNOSIS — R5383 Other fatigue: Secondary | ICD-10-CM | POA: Diagnosis not present

## 2017-10-16 DIAGNOSIS — E1143 Type 2 diabetes mellitus with diabetic autonomic (poly)neuropathy: Secondary | ICD-10-CM | POA: Diagnosis not present

## 2017-10-16 DIAGNOSIS — I10 Essential (primary) hypertension: Secondary | ICD-10-CM | POA: Diagnosis not present

## 2017-10-16 DIAGNOSIS — K3184 Gastroparesis: Secondary | ICD-10-CM | POA: Diagnosis not present

## 2017-10-16 DIAGNOSIS — Z794 Long term (current) use of insulin: Secondary | ICD-10-CM | POA: Diagnosis not present

## 2017-10-16 DIAGNOSIS — Z7982 Long term (current) use of aspirin: Secondary | ICD-10-CM | POA: Diagnosis not present

## 2017-10-16 DIAGNOSIS — Z23 Encounter for immunization: Secondary | ICD-10-CM | POA: Diagnosis not present

## 2017-10-16 DIAGNOSIS — Z882 Allergy status to sulfonamides status: Secondary | ICD-10-CM | POA: Diagnosis not present

## 2017-10-16 DIAGNOSIS — Z841 Family history of disorders of kidney and ureter: Secondary | ICD-10-CM | POA: Diagnosis not present

## 2017-10-18 ENCOUNTER — Encounter (HOSPITAL_COMMUNITY): Payer: Self-pay

## 2017-10-18 DIAGNOSIS — E291 Testicular hypofunction: Secondary | ICD-10-CM | POA: Diagnosis not present

## 2017-10-20 LAB — CHROMOSOME ANALYSIS, BONE MARROW

## 2017-10-20 LAB — TISSUE HYBRIDIZATION (BONE MARROW)-NCBH

## 2017-10-24 ENCOUNTER — Other Ambulatory Visit: Payer: Medicare Other

## 2017-10-24 DIAGNOSIS — K7460 Unspecified cirrhosis of liver: Secondary | ICD-10-CM | POA: Diagnosis not present

## 2017-10-24 DIAGNOSIS — R162 Hepatomegaly with splenomegaly, not elsewhere classified: Secondary | ICD-10-CM | POA: Diagnosis not present

## 2017-10-27 DIAGNOSIS — Z7982 Long term (current) use of aspirin: Secondary | ICD-10-CM | POA: Diagnosis not present

## 2017-10-27 DIAGNOSIS — K7581 Nonalcoholic steatohepatitis (NASH): Secondary | ICD-10-CM | POA: Diagnosis not present

## 2017-10-27 DIAGNOSIS — Z794 Long term (current) use of insulin: Secondary | ICD-10-CM | POA: Diagnosis not present

## 2017-10-27 DIAGNOSIS — E785 Hyperlipidemia, unspecified: Secondary | ICD-10-CM | POA: Diagnosis not present

## 2017-10-27 DIAGNOSIS — Z882 Allergy status to sulfonamides status: Secondary | ICD-10-CM | POA: Diagnosis not present

## 2017-10-27 DIAGNOSIS — D500 Iron deficiency anemia secondary to blood loss (chronic): Secondary | ICD-10-CM | POA: Diagnosis not present

## 2017-10-27 DIAGNOSIS — K3184 Gastroparesis: Secondary | ICD-10-CM | POA: Diagnosis not present

## 2017-10-27 DIAGNOSIS — K31811 Angiodysplasia of stomach and duodenum with bleeding: Secondary | ICD-10-CM | POA: Diagnosis not present

## 2017-10-27 DIAGNOSIS — E1143 Type 2 diabetes mellitus with diabetic autonomic (poly)neuropathy: Secondary | ICD-10-CM | POA: Diagnosis not present

## 2017-10-27 DIAGNOSIS — Z79899 Other long term (current) drug therapy: Secondary | ICD-10-CM | POA: Diagnosis not present

## 2017-10-27 DIAGNOSIS — Z888 Allergy status to other drugs, medicaments and biological substances status: Secondary | ICD-10-CM | POA: Diagnosis not present

## 2017-10-27 DIAGNOSIS — K7460 Unspecified cirrhosis of liver: Secondary | ICD-10-CM | POA: Diagnosis not present

## 2017-10-27 DIAGNOSIS — I8511 Secondary esophageal varices with bleeding: Secondary | ICD-10-CM | POA: Diagnosis not present

## 2017-10-27 DIAGNOSIS — Z7951 Long term (current) use of inhaled steroids: Secondary | ICD-10-CM | POA: Diagnosis not present

## 2017-10-27 DIAGNOSIS — Z880 Allergy status to penicillin: Secondary | ICD-10-CM | POA: Diagnosis not present

## 2017-10-27 DIAGNOSIS — I10 Essential (primary) hypertension: Secondary | ICD-10-CM | POA: Diagnosis not present

## 2017-10-27 DIAGNOSIS — I8500 Esophageal varices without bleeding: Secondary | ICD-10-CM | POA: Diagnosis not present

## 2017-10-27 DIAGNOSIS — Z6830 Body mass index (BMI) 30.0-30.9, adult: Secondary | ICD-10-CM | POA: Diagnosis not present

## 2017-10-27 DIAGNOSIS — E669 Obesity, unspecified: Secondary | ICD-10-CM | POA: Diagnosis not present

## 2017-11-02 DIAGNOSIS — E291 Testicular hypofunction: Secondary | ICD-10-CM | POA: Diagnosis not present

## 2017-11-02 DIAGNOSIS — E538 Deficiency of other specified B group vitamins: Secondary | ICD-10-CM | POA: Diagnosis not present

## 2017-11-09 ENCOUNTER — Emergency Department (HOSPITAL_COMMUNITY)
Admission: EM | Admit: 2017-11-09 | Discharge: 2017-11-09 | Disposition: A | Discharge status: Home / Self Care | Payer: Medicare Other | Attending: Emergency Medicine | Admitting: Emergency Medicine

## 2017-11-09 ENCOUNTER — Encounter (HOSPITAL_COMMUNITY): Payer: Self-pay | Admitting: Emergency Medicine

## 2017-11-09 DIAGNOSIS — R109 Unspecified abdominal pain: Secondary | ICD-10-CM | POA: Diagnosis not present

## 2017-11-09 DIAGNOSIS — Z5321 Procedure and treatment not carried out due to patient leaving prior to being seen by health care provider: Secondary | ICD-10-CM | POA: Diagnosis not present

## 2017-11-09 LAB — CBC
HCT: 29.7 % — ABNORMAL LOW (ref 39.0–52.0)
Hemoglobin: 9 g/dL — ABNORMAL LOW (ref 13.0–17.0)
MCH: 24.8 pg — AB (ref 26.0–34.0)
MCHC: 30.3 g/dL (ref 30.0–36.0)
MCV: 81.8 fL (ref 78.0–100.0)
Platelets: 94 10*3/uL — ABNORMAL LOW (ref 150–400)
RBC: 3.63 MIL/uL — AB (ref 4.22–5.81)
RDW: 15.8 % — ABNORMAL HIGH (ref 11.5–15.5)
WBC: 4.4 10*3/uL (ref 4.0–10.5)

## 2017-11-09 LAB — COMPREHENSIVE METABOLIC PANEL
ALT: 31 U/L (ref 17–63)
AST: 35 U/L (ref 15–41)
Albumin: 4 g/dL (ref 3.5–5.0)
Alkaline Phosphatase: 71 U/L (ref 38–126)
Anion gap: 12 (ref 5–15)
BUN: 39 mg/dL — ABNORMAL HIGH (ref 6–20)
CHLORIDE: 100 mmol/L — AB (ref 101–111)
CO2: 20 mmol/L — AB (ref 22–32)
CREATININE: 1.44 mg/dL — AB (ref 0.61–1.24)
Calcium: 10.3 mg/dL (ref 8.9–10.3)
GFR, EST AFRICAN AMERICAN: 55 mL/min — AB (ref >60)
GFR, EST NON AFRICAN AMERICAN: 47 mL/min — AB (ref >60)
Glucose, Bld: 364 mg/dL — ABNORMAL HIGH (ref 65–99)
Potassium: 4.6 mmol/L (ref 3.5–5.1)
SODIUM: 132 mmol/L — AB (ref 135–145)
Total Bilirubin: 0.9 mg/dL (ref 0.3–1.2)
Total Protein: 7 g/dL (ref 6.5–8.1)

## 2017-11-09 LAB — URINALYSIS, ROUTINE W REFLEX MICROSCOPIC
BACTERIA UA: NONE SEEN
Bilirubin Urine: NEGATIVE
KETONES UR: NEGATIVE mg/dL
LEUKOCYTES UA: NEGATIVE
Nitrite: NEGATIVE
PROTEIN: 30 mg/dL — AB
RBC / HPF: NONE SEEN RBC/hpf (ref 0–5)
SQUAMOUS EPITHELIAL / LPF: NONE SEEN
Specific Gravity, Urine: 1.008 (ref 1.005–1.030)
WBC UA: NONE SEEN WBC/hpf (ref 0–5)
pH: 5 (ref 5.0–8.0)

## 2017-11-09 LAB — LIPASE, BLOOD: Lipase: 65 U/L — ABNORMAL HIGH (ref 11–51)

## 2017-11-09 NOTE — ED Triage Notes (Signed)
Patient reports mid abdominal pain with nausea onset yesterday , denies emesis or diarrhea , no fever or chills .

## 2017-11-16 DIAGNOSIS — E291 Testicular hypofunction: Secondary | ICD-10-CM | POA: Diagnosis not present

## 2017-11-20 ENCOUNTER — Other Ambulatory Visit: Payer: Self-pay

## 2017-11-20 ENCOUNTER — Encounter: Payer: Self-pay | Admitting: Oncology

## 2017-11-20 ENCOUNTER — Ambulatory Visit (INDEPENDENT_AMBULATORY_CARE_PROVIDER_SITE_OTHER): Payer: Medicare Other | Admitting: Oncology

## 2017-11-20 VITALS — BP 131/50 | HR 82 | Temp 98.0°F | Ht 66.0 in | Wt 184.3 lb

## 2017-11-20 DIAGNOSIS — R161 Splenomegaly, not elsewhere classified: Secondary | ICD-10-CM | POA: Diagnosis not present

## 2017-11-20 DIAGNOSIS — Z888 Allergy status to other drugs, medicaments and biological substances status: Secondary | ICD-10-CM

## 2017-11-20 DIAGNOSIS — K766 Portal hypertension: Secondary | ICD-10-CM | POA: Diagnosis not present

## 2017-11-20 DIAGNOSIS — D631 Anemia in chronic kidney disease: Secondary | ICD-10-CM | POA: Diagnosis not present

## 2017-11-20 DIAGNOSIS — K7460 Unspecified cirrhosis of liver: Secondary | ICD-10-CM

## 2017-11-20 DIAGNOSIS — I8510 Secondary esophageal varices without bleeding: Secondary | ICD-10-CM

## 2017-11-20 DIAGNOSIS — N183 Chronic kidney disease, stage 3 (moderate): Secondary | ICD-10-CM

## 2017-11-20 DIAGNOSIS — D649 Anemia, unspecified: Secondary | ICD-10-CM | POA: Diagnosis not present

## 2017-11-20 DIAGNOSIS — Z882 Allergy status to sulfonamides status: Secondary | ICD-10-CM

## 2017-11-20 DIAGNOSIS — K31811 Angiodysplasia of stomach and duodenum with bleeding: Secondary | ICD-10-CM | POA: Diagnosis not present

## 2017-11-20 DIAGNOSIS — E1122 Type 2 diabetes mellitus with diabetic chronic kidney disease: Secondary | ICD-10-CM | POA: Diagnosis not present

## 2017-11-20 DIAGNOSIS — Z881 Allergy status to other antibiotic agents status: Secondary | ICD-10-CM | POA: Diagnosis not present

## 2017-11-20 DIAGNOSIS — Z87891 Personal history of nicotine dependence: Secondary | ICD-10-CM

## 2017-11-20 DIAGNOSIS — D61818 Other pancytopenia: Secondary | ICD-10-CM | POA: Diagnosis present

## 2017-11-20 DIAGNOSIS — Z5181 Encounter for therapeutic drug level monitoring: Secondary | ICD-10-CM | POA: Diagnosis not present

## 2017-11-20 DIAGNOSIS — Z794 Long term (current) use of insulin: Secondary | ICD-10-CM | POA: Diagnosis not present

## 2017-11-20 NOTE — Progress Notes (Signed)
Hematology and Oncology Follow Up Visit  Javier Potter [id number] 03/18/1946 72 y.o. 11/20/2017 11:32 AM   Principle Diagnosis: Encounter Diagnosis  Name Primary?  . Pancytopenia ([place]) Yes     Interim History:   Follow-up visit for this 72 year old man and his wife to discuss results of recent evaluation of pancytopenia and fluctuating iron deficiency anemia.  Please see my office consultation note dated July 04, 2017 for complete details of his complicated medical history. Briefly, his anemia is multifactorial with components from stage III chronic renal insufficiency, acute blood loss, and cirrhosis. Pancytopenia determined to be from cirrhosis with no underlying bone marrow pathology including cytogenetic studies done on bone marrow aspiration and biopsy September 20, 2017. He is known to have grade 1 esophageal varices.  These were not bleeding at time of a June 2018 upper endoscopy.  He was found to have portal hypertension with splenomegaly. However, at time of my evaluation in November 2018, he had a low MCV, a low ferritin, with recent history of a abrupt fall in his hemoglobin down to 8.1 the previous month which was already responding to iron replacement with hemoglobin of 10.6, MCV 80, at time of his November eighth visit with me.  Since that time he has had another abrupt fall in his hemoglobin down to 7.5 on September 20, 2017.  He had a repeat evaluation at [facility] with upper endoscopy done March 1.  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  Duodenum was normal.  I reviewed these records and results of the bone marrow biopsy with the patient and his wife in detail.  He has no history of aortic valve disease on a October 2015 echocardiogram which is the main cause of acquired von Willebrand's disease which predisposes towards AVMs of the GI tract.   Unfortunately, even if this is acquired von Willebrand's disorder, it does not respond to the usual maneuvers such as DDAVP or even clotting factor concentrates.  Care is supportive with repetitive transfusions and iron infusions.   Medications: reviewed  Allergies:  Allergies  Allergen Reactions  . Augmentin [Amoxicillin-Pot Clavulanate] Swelling and Other (See Comments)    Tongue swells Patient states has tolerated amoxicillin  . Sulfa Antibiotics Swelling and Other (See Comments)    Tongue swells  . Ambien [Zolpidem Tartrate] Other (See Comments)    "did not sit well with system..made me go crazy"    Review of Systems: See interim history  Physical Exam: Patient not examined today Blood pressure (!) 131/50, pulse 82, temperature 98 F (36.7 C), temperature source Oral, height 5' 6" (1.676 m), weight 184 lb 4.8 oz (83.6 kg), SpO2 100 %. Wt Readings from Last 3 Encounters:  11/20/17 184 lb 4.8 oz (83.6 kg)  11/09/17 186 lb (84.4 kg)  09/20/17 187 lb (84.8 kg)       Lab Results: CBC W/Diff    Component Value Date/Time   WBC 4.4 11/09/2017 2003   RBC 3.63 (L) 11/09/2017 2003   HGB 9.0 (L) 11/09/2017 2003   HCT 29.7 (L) 11/09/2017 2003   PLT 94 (L) 11/09/2017 2003   MCV 81.8 11/09/2017 2003   MCH 24.8 (L) 11/09/2017 2003   MCHC 30.3 11/09/2017 2003   RDW 15.8 (H) 11/09/2017 2003   LYMPHSABS 0.5 (L) 09/20/2017 0708   MONOABS 0.3 09/20/2017 0708   EOSABS 0.1 09/20/2017 0708   BASOSABS  0.0 09/20/2017 0708     Chemistry      Component Value Date/Time   NA 132 (L) 11/09/2017 2003   NA 142 07/04/2017 1610   K 4.6 11/09/2017 2003   CL 100 (L) 11/09/2017 2003   CO2 20 (L) 11/09/2017 2003   BUN 39 (H) 11/09/2017 2003   BUN 33 (H) 07/04/2017 1610   CREATININE 1.44 (H) 11/09/2017 2003      Component Value Date/Time   CALCIUM 10.3 11/09/2017 2003   ALKPHOS 71 11/09/2017 2003   AST 35 11/09/2017 2003   ALT 31 11/09/2017 2003   BILITOT 0.9 11/09/2017 2003   BILITOT  0.4 07/04/2017 1610       Radiological Studies: No results found.  Impression:  1.  Pancytopenia secondary to cirrhosis/portal hypertension/splenomegaly  2.  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  Anemia of chronic renal insufficiency complicating above  4.  Nonbleeding stage I esophageal varices secondary to portal hypertension  Recommendation: I would check a CBC every 2-3 months along with a ferritin. It is more efficient to replete iron stores with parenteral rather than oral iron when ferritin is low to minimize need for blood transfusion. Most readily available preparation is feraheme 510 mg given IV over 15 minutes  weekly x2.  There is an epic order set and this preparation can be given in any short stay unit.  Recent review of contemporary parenteral iron preparations show there is only a 1 in 250,000 incidence of a serious infusion reaction.  In one meta-analysis of over 10,000 patients receiving IV iron, and a more recent 2018 prospective study of 2000 patients, there was a 0% incidence of anaphylaxis.  100% of this visit was spent with direct face-to-face patient contact with consultation and review of data.  CC: Patient Care Team: Josetta Huddle, MD as PCP - General (Internal Medicine) Levin Erp, OD as Referring Physician (Optometry) Corliss Parish, MD as Consulting Physician (Nephrology)   Murriel Hopper, MD, [place]  Hematology-Oncology/Internal Medicine     3/25/201911:32 AM

## 2017-11-20 NOTE — Patient Instructions (Signed)
Return as needed I recommend having a blood count and a ferritin every 3 months  Use intravenous iron (feraheme)  to rapidly replenish iron stores (510 mg weekly x 2) if ferritin low

## 2017-11-23 DIAGNOSIS — F329 Major depressive disorder, single episode, unspecified: Secondary | ICD-10-CM | POA: Diagnosis not present

## 2017-11-23 DIAGNOSIS — M19049 Primary osteoarthritis, unspecified hand: Secondary | ICD-10-CM | POA: Diagnosis not present

## 2017-11-23 DIAGNOSIS — E782 Mixed hyperlipidemia: Secondary | ICD-10-CM | POA: Diagnosis not present

## 2017-11-23 DIAGNOSIS — E1121 Type 2 diabetes mellitus with diabetic nephropathy: Secondary | ICD-10-CM | POA: Diagnosis not present

## 2017-11-23 DIAGNOSIS — E1122 Type 2 diabetes mellitus with diabetic chronic kidney disease: Secondary | ICD-10-CM | POA: Diagnosis not present

## 2017-11-23 DIAGNOSIS — D649 Anemia, unspecified: Secondary | ICD-10-CM | POA: Diagnosis not present

## 2017-11-23 DIAGNOSIS — I10 Essential (primary) hypertension: Secondary | ICD-10-CM | POA: Diagnosis not present

## 2017-11-23 DIAGNOSIS — N183 Chronic kidney disease, stage 3 (moderate): Secondary | ICD-10-CM | POA: Diagnosis not present

## 2017-11-23 DIAGNOSIS — D509 Iron deficiency anemia, unspecified: Secondary | ICD-10-CM | POA: Diagnosis not present

## 2017-11-23 DIAGNOSIS — E1165 Type 2 diabetes mellitus with hyperglycemia: Secondary | ICD-10-CM | POA: Diagnosis not present

## 2017-11-23 DIAGNOSIS — Z794 Long term (current) use of insulin: Secondary | ICD-10-CM | POA: Diagnosis not present

## 2017-11-30 DIAGNOSIS — D649 Anemia, unspecified: Secondary | ICD-10-CM | POA: Diagnosis not present

## 2017-11-30 DIAGNOSIS — E291 Testicular hypofunction: Secondary | ICD-10-CM | POA: Diagnosis not present

## 2017-12-08 ENCOUNTER — Other Ambulatory Visit: Payer: Self-pay | Admitting: Gastroenterology

## 2017-12-08 DIAGNOSIS — R5383 Other fatigue: Secondary | ICD-10-CM | POA: Diagnosis not present

## 2017-12-08 DIAGNOSIS — Z86010 Personal history of colonic polyps: Secondary | ICD-10-CM | POA: Diagnosis not present

## 2017-12-08 DIAGNOSIS — D649 Anemia, unspecified: Secondary | ICD-10-CM | POA: Diagnosis not present

## 2017-12-08 DIAGNOSIS — K7469 Other cirrhosis of liver: Secondary | ICD-10-CM

## 2017-12-08 DIAGNOSIS — K7460 Unspecified cirrhosis of liver: Secondary | ICD-10-CM | POA: Diagnosis not present

## 2017-12-08 DIAGNOSIS — R188 Other ascites: Secondary | ICD-10-CM | POA: Diagnosis not present

## 2017-12-11 DIAGNOSIS — G4733 Obstructive sleep apnea (adult) (pediatric): Secondary | ICD-10-CM | POA: Diagnosis not present

## 2017-12-11 DIAGNOSIS — G4700 Insomnia, unspecified: Secondary | ICD-10-CM | POA: Diagnosis not present

## 2017-12-11 DIAGNOSIS — K7460 Unspecified cirrhosis of liver: Secondary | ICD-10-CM | POA: Diagnosis not present

## 2017-12-11 DIAGNOSIS — F329 Major depressive disorder, single episode, unspecified: Secondary | ICD-10-CM | POA: Diagnosis not present

## 2017-12-11 DIAGNOSIS — E1140 Type 2 diabetes mellitus with diabetic neuropathy, unspecified: Secondary | ICD-10-CM | POA: Diagnosis not present

## 2017-12-11 DIAGNOSIS — R5383 Other fatigue: Secondary | ICD-10-CM | POA: Diagnosis not present

## 2017-12-14 DIAGNOSIS — E291 Testicular hypofunction: Secondary | ICD-10-CM | POA: Diagnosis not present

## 2017-12-15 ENCOUNTER — Ambulatory Visit
Admission: RE | Admit: 2017-12-15 | Discharge: 2017-12-15 | Disposition: A | Discharge status: Home / Self Care | Payer: Medicare Other | Source: Ambulatory Visit | Attending: Gastroenterology | Admitting: Gastroenterology

## 2017-12-15 DIAGNOSIS — K7469 Other cirrhosis of liver: Secondary | ICD-10-CM

## 2017-12-15 DIAGNOSIS — K7460 Unspecified cirrhosis of liver: Secondary | ICD-10-CM | POA: Diagnosis not present

## 2017-12-26 DIAGNOSIS — N183 Chronic kidney disease, stage 3 (moderate): Secondary | ICD-10-CM | POA: Diagnosis not present

## 2017-12-26 DIAGNOSIS — I10 Essential (primary) hypertension: Secondary | ICD-10-CM | POA: Diagnosis not present

## 2017-12-26 DIAGNOSIS — E782 Mixed hyperlipidemia: Secondary | ICD-10-CM | POA: Diagnosis not present

## 2017-12-26 DIAGNOSIS — E1121 Type 2 diabetes mellitus with diabetic nephropathy: Secondary | ICD-10-CM | POA: Diagnosis not present

## 2017-12-26 DIAGNOSIS — F329 Major depressive disorder, single episode, unspecified: Secondary | ICD-10-CM | POA: Diagnosis not present

## 2017-12-26 DIAGNOSIS — Z794 Long term (current) use of insulin: Secondary | ICD-10-CM | POA: Diagnosis not present

## 2017-12-26 DIAGNOSIS — M19049 Primary osteoarthritis, unspecified hand: Secondary | ICD-10-CM | POA: Diagnosis not present

## 2017-12-26 DIAGNOSIS — E1165 Type 2 diabetes mellitus with hyperglycemia: Secondary | ICD-10-CM | POA: Diagnosis not present

## 2017-12-26 DIAGNOSIS — D509 Iron deficiency anemia, unspecified: Secondary | ICD-10-CM | POA: Diagnosis not present

## 2017-12-28 DIAGNOSIS — D509 Iron deficiency anemia, unspecified: Secondary | ICD-10-CM | POA: Diagnosis not present

## 2017-12-28 DIAGNOSIS — E291 Testicular hypofunction: Secondary | ICD-10-CM | POA: Diagnosis not present

## 2018-01-08 DIAGNOSIS — G4700 Insomnia, unspecified: Secondary | ICD-10-CM | POA: Diagnosis not present

## 2018-01-08 DIAGNOSIS — K7460 Unspecified cirrhosis of liver: Secondary | ICD-10-CM | POA: Diagnosis not present

## 2018-01-10 DIAGNOSIS — K7460 Unspecified cirrhosis of liver: Secondary | ICD-10-CM | POA: Diagnosis not present

## 2018-01-10 DIAGNOSIS — E7220 Disorder of urea cycle metabolism, unspecified: Secondary | ICD-10-CM | POA: Diagnosis not present

## 2018-01-11 DIAGNOSIS — E7220 Disorder of urea cycle metabolism, unspecified: Secondary | ICD-10-CM | POA: Diagnosis not present

## 2018-01-11 DIAGNOSIS — E291 Testicular hypofunction: Secondary | ICD-10-CM | POA: Diagnosis not present

## 2018-01-11 DIAGNOSIS — K7460 Unspecified cirrhosis of liver: Secondary | ICD-10-CM | POA: Diagnosis not present

## 2018-01-12 DIAGNOSIS — E1140 Type 2 diabetes mellitus with diabetic neuropathy, unspecified: Secondary | ICD-10-CM | POA: Diagnosis not present

## 2018-01-12 DIAGNOSIS — Z794 Long term (current) use of insulin: Secondary | ICD-10-CM | POA: Diagnosis not present

## 2018-01-12 DIAGNOSIS — E782 Mixed hyperlipidemia: Secondary | ICD-10-CM | POA: Diagnosis not present

## 2018-01-12 DIAGNOSIS — D509 Iron deficiency anemia, unspecified: Secondary | ICD-10-CM | POA: Diagnosis not present

## 2018-01-12 DIAGNOSIS — F329 Major depressive disorder, single episode, unspecified: Secondary | ICD-10-CM | POA: Diagnosis not present

## 2018-01-12 DIAGNOSIS — N183 Chronic kidney disease, stage 3 (moderate): Secondary | ICD-10-CM | POA: Diagnosis not present

## 2018-01-12 DIAGNOSIS — M19049 Primary osteoarthritis, unspecified hand: Secondary | ICD-10-CM | POA: Diagnosis not present

## 2018-01-12 DIAGNOSIS — E1165 Type 2 diabetes mellitus with hyperglycemia: Secondary | ICD-10-CM | POA: Diagnosis not present

## 2018-01-12 DIAGNOSIS — I10 Essential (primary) hypertension: Secondary | ICD-10-CM | POA: Diagnosis not present

## 2018-01-23 DIAGNOSIS — E7220 Disorder of urea cycle metabolism, unspecified: Secondary | ICD-10-CM | POA: Diagnosis not present

## 2018-01-23 DIAGNOSIS — K7460 Unspecified cirrhosis of liver: Secondary | ICD-10-CM | POA: Diagnosis not present

## 2018-01-24 DIAGNOSIS — E7220 Disorder of urea cycle metabolism, unspecified: Secondary | ICD-10-CM | POA: Diagnosis not present

## 2018-01-24 DIAGNOSIS — E291 Testicular hypofunction: Secondary | ICD-10-CM | POA: Diagnosis not present

## 2018-01-24 DIAGNOSIS — G4700 Insomnia, unspecified: Secondary | ICD-10-CM | POA: Diagnosis not present

## 2018-01-24 DIAGNOSIS — K7460 Unspecified cirrhosis of liver: Secondary | ICD-10-CM | POA: Diagnosis not present

## 2018-01-24 DIAGNOSIS — R252 Cramp and spasm: Secondary | ICD-10-CM | POA: Diagnosis not present

## 2018-01-25 ENCOUNTER — Encounter (HOSPITAL_COMMUNITY): Payer: Self-pay

## 2018-01-25 ENCOUNTER — Emergency Department (HOSPITAL_COMMUNITY)
Admission: EM | Admit: 2018-01-25 | Discharge: 2018-01-25 | Disposition: A | Discharge status: Home / Self Care | Payer: Medicare Other | Attending: Physician Assistant | Admitting: Physician Assistant

## 2018-01-25 ENCOUNTER — Other Ambulatory Visit: Payer: Self-pay

## 2018-01-25 DIAGNOSIS — N183 Chronic kidney disease, stage 3 (moderate): Secondary | ICD-10-CM | POA: Diagnosis not present

## 2018-01-25 DIAGNOSIS — E785 Hyperlipidemia, unspecified: Secondary | ICD-10-CM | POA: Diagnosis not present

## 2018-01-25 DIAGNOSIS — I130 Hypertensive heart and chronic kidney disease with heart failure and stage 1 through stage 4 chronic kidney disease, or unspecified chronic kidney disease: Secondary | ICD-10-CM | POA: Diagnosis not present

## 2018-01-25 DIAGNOSIS — Z794 Long term (current) use of insulin: Secondary | ICD-10-CM | POA: Diagnosis not present

## 2018-01-25 DIAGNOSIS — R739 Hyperglycemia, unspecified: Secondary | ICD-10-CM

## 2018-01-25 DIAGNOSIS — E1143 Type 2 diabetes mellitus with diabetic autonomic (poly)neuropathy: Secondary | ICD-10-CM | POA: Diagnosis not present

## 2018-01-25 DIAGNOSIS — I10 Essential (primary) hypertension: Secondary | ICD-10-CM | POA: Diagnosis not present

## 2018-01-25 DIAGNOSIS — Z79899 Other long term (current) drug therapy: Secondary | ICD-10-CM | POA: Insufficient documentation

## 2018-01-25 DIAGNOSIS — F329 Major depressive disorder, single episode, unspecified: Secondary | ICD-10-CM | POA: Diagnosis not present

## 2018-01-25 DIAGNOSIS — I509 Heart failure, unspecified: Secondary | ICD-10-CM | POA: Insufficient documentation

## 2018-01-25 DIAGNOSIS — D649 Anemia, unspecified: Secondary | ICD-10-CM | POA: Diagnosis not present

## 2018-01-25 DIAGNOSIS — K7460 Unspecified cirrhosis of liver: Secondary | ICD-10-CM | POA: Diagnosis not present

## 2018-01-25 DIAGNOSIS — E1165 Type 2 diabetes mellitus with hyperglycemia: Secondary | ICD-10-CM | POA: Insufficient documentation

## 2018-01-25 LAB — CBC
HEMATOCRIT: 27.2 % — AB (ref 39.0–52.0)
HEMOGLOBIN: 8.5 g/dL — AB (ref 13.0–17.0)
MCH: 26.1 pg (ref 26.0–34.0)
MCHC: 31.3 g/dL (ref 30.0–36.0)
MCV: 83.4 fL (ref 78.0–100.0)
Platelets: 76 10*3/uL — ABNORMAL LOW (ref 150–400)
RBC: 3.26 MIL/uL — AB (ref 4.22–5.81)
RDW: 15.9 % — ABNORMAL HIGH (ref 11.5–15.5)
WBC: 3.6 10*3/uL — ABNORMAL LOW (ref 4.0–10.5)

## 2018-01-25 LAB — URINALYSIS, ROUTINE W REFLEX MICROSCOPIC
BACTERIA UA: NONE SEEN
Bilirubin Urine: NEGATIVE
Glucose, UA: >=500 mg/dL — AB
Hgb urine dipstick: NEGATIVE
KETONES UR: NEGATIVE mg/dL
Leukocytes, UA: NEGATIVE
Nitrite: NEGATIVE
PH: 5 (ref 5.0–8.0)
PROTEIN: NEGATIVE mg/dL
Specific Gravity, Urine: 1.008 (ref 1.005–1.030)

## 2018-01-25 LAB — BASIC METABOLIC PANEL
ANION GAP: 8 (ref 5–15)
BUN: 41 mg/dL — ABNORMAL HIGH (ref 6–20)
CALCIUM: 9.8 mg/dL (ref 8.9–10.3)
CHLORIDE: 99 mmol/L — AB (ref 101–111)
CO2: 24 mmol/L (ref 22–32)
Creatinine, Ser: 2.05 mg/dL — ABNORMAL HIGH (ref 0.61–1.24)
GFR calc non Af Amer: 31 mL/min — ABNORMAL LOW (ref >60)
GFR, EST AFRICAN AMERICAN: 36 mL/min — AB (ref >60)
Glucose, Bld: 558 mg/dL (ref 65–99)
POTASSIUM: 5.8 mmol/L — AB (ref 3.5–5.1)
Sodium: 131 mmol/L — ABNORMAL LOW (ref 135–145)

## 2018-01-25 LAB — CBG MONITORING, ED
GLUCOSE-CAPILLARY: 513 mg/dL — AB (ref 65–99)
Glucose-Capillary: 410 mg/dL — ABNORMAL HIGH (ref 65–99)

## 2018-01-25 LAB — LIPASE, BLOOD: LIPASE: 70 U/L — AB (ref 11–51)

## 2018-01-25 MED ORDER — INSULIN ASPART 100 UNIT/ML ~~LOC~~ SOLN: 105.0000 [IU] | Freq: Once | SUBCUTANEOUS | Status: DC

## 2018-01-25 MED ORDER — SODIUM CHLORIDE 0.9 % IV BOLUS: 1000.0000 mL | Freq: Once | INTRAVENOUS | Status: DC

## 2018-01-25 NOTE — ED Notes (Signed)
ED Provider at bedside. 

## 2018-01-25 NOTE — ED Provider Notes (Signed)
[facility] EMERGENCY DEPARTMENT Provider Note   CSN: [id number] Arrival date & time: 01/25/18  1322     History   Chief Complaint Chief Complaint  Patient presents with  . Hyperglycemia    HPI Javier Potter is a 72 y.o. male.  HPI   72 year old male past medical history significant for cirrhosis, esophageal varices, portal hypertension, diabetes, insulin-dependent, pancytopenia.  He is presenting today being told to come here by his gastroenterologist.  Gastroenterology did labs today found him to have sugar in the 500s and sent him to the ED for "fluids".  Patient already has insulin at home.  Patient had no infectious symptoms.  Of note patient has noticed that an increase in his sugars over the last several weeks to months.  Has been keeping detailed records and been slowly increasing his insulin as an outpatient with his primary care provider.  Patient's hemoglobin A1c went from 6 6 months ago to 11 more recently.  He is been having very poor control of his glucose.  Past Medical History:  Diagnosis Date  . Anemia    hx  . Arthritis    "hands" (06/22/2016)  . Carotid stenosis, right   . CHF (congestive heart failure) ([place])   . Chronic kidney disease (CKD), stage III (moderate) (HCC)   . Cirrhosis, cryptogenic ([place]) 07/04/2017  . GERD (gastroesophageal reflux disease)    under control  . High cholesterol   . Hypertension   . Murmur    had since childhood-no problems  . Pancytopenia ([place]) 07/04/2017  . Sleep apnea    "can't tolerate mask" (06/22/2016)  . Type II diabetes mellitus [facility])     Patient Active Problem List   Diagnosis Date Noted  . Pancytopenia ([place]) 07/04/2017  . Cirrhosis, cryptogenic ([place]) 07/04/2017  . Carotid artery disease ([place]) 06/22/2016  . History of laparoscopic cholecystectomy 11/14/2014    Past Surgical History:  Procedure Laterality Date  . CAROTID ENDARTERECTOMY Right 06/22/2016  . CATARACT EXTRACTION W/  INTRAOCULAR LENS  IMPLANT, BILATERAL Bilateral 09-2015, 11-2015  . CHOLECYSTECTOMY N/A 11/14/2014   Procedure: LAPAROSCOPIC CHOLECYSTECTOMY WITH INTRAOPERATIVE CHOLANGIOGRAM;  Surgeon: Johnathan Hausen, MD;  [facility];  Service: General;  Laterality: N/A;  . COLONOSCOPY WITH PROPOFOL N/A 07/28/2014   Procedure: COLONOSCOPY WITH PROPOFOL;  Surgeon: Garlan Fair, MD;  [facility];  Service: Endoscopy;  Laterality: N/A;  . ENDARTERECTOMY Right 06/22/2016   Procedure: Right Carotid ENDARTERECTOMY;  Surgeon: Rosetta Posner, MD;  Location: [place];  Service: Vascular;  Laterality: Right;  . ESOPHAGOGASTRODUODENOSCOPY (EGD) WITH PROPOFOL N/A 07/28/2014   Procedure: ESOPHAGOGASTRODUODENOSCOPY (EGD) WITH PROPOFOL;  Surgeon: Garlan Fair, MD;  [facility];  Service: Endoscopy;  Laterality: N/A;  . NASAL SINUS SURGERY  ~ 2000  . PATCH ANGIOPLASTY  06/22/2016   Procedure: PATCH ANGIOPLASTY;  Surgeon: Rosetta Posner, MD;  Location: [place];  Service: Vascular;;  . TONSILLECTOMY  age 24  . [place] EXTRACTION  1963?        Home Medications    Prior to Admission medications   Medication Sig Start Date End Date Taking? Authorizing Provider  aspirin EC 81 MG tablet Take 81 mg by mouth at bedtime.    [provider]  Cholecalciferol (VITAMIN D) 2000 UNITS CAPS Take 2,000 Units by mouth every morning.     [provider]  cyanocobalamin (,VITAMIN B-12,) 1000 MCG/ML injection Inject 1,000 mcg into the muscle every 30 (thirty) days.  [provider]  Ferrous Sulfate (IRON) 325 (65 FE) MG TABS Take 325 mg by mouth 2 (two) times daily.     [provider]  fluticasone (FLONASE) 50 MCG/ACT nasal spray Place 1 spray into both nostrils 2 (two) times daily as needed for allergies or rhinitis.  06/14/17   [provider]  furosemide (LASIX) 20 MG tablet Take 20 mg by mouth daily. 06/08/17   [provider]  glucosamine-chondroitin  500-400 MG tablet Take 2 tablets by mouth daily.    [provider]  insulin aspart (NOVOLOG) 100 UNIT/ML injection Inject 0-40 Units into the skin 3 (three) times daily with meals. Per sliding scale    [provider]  LANTUS SOLOSTAR 100 UNIT/ML Solostar Pen INJECT 30 UNITS WITH EACH MEAL 3 TIMES A DAY 04/30/17   [provider]  lisinopril (PRINIVIL,ZESTRIL) 20 MG tablet Take 20 mg by mouth at bedtime.    [provider]  metoprolol succinate (TOPROL-XL) 50 MG 24 hr tablet Take 50 mg by mouth every morning. Take with or immediately following a meal.    [provider]  NOVOLOG FLEXPEN 100 UNIT/ML FlexPen INJECT 30 TO 60 UNITS UNDER THE SKIN TWICE DAILY WITH MEALS 07/06/17   [provider]  omeprazole (PRILOSEC) 20 MG capsule Take 20 mg by mouth 2 (two) times daily before a meal.     [provider]  psyllium (METAMUCIL) 58.6 % powder Take 1 packet by mouth daily.     [provider]  [facility] injection TO BE ADMINISTERED BY PHARMACIST FOR IMMUNIZATION 06/06/17   [provider]  sildenafil (REVATIO) 20 MG tablet Take 20 mg by mouth as needed (for ED).  04/18/16   [provider]  spironolactone (ALDACTONE) 50 MG tablet Take 50 mg by mouth 2 (two) times daily with a meal. 06/08/17   [provider]  temazepam (RESTORIL) 15 MG capsule Take 15 mg by mouth at bedtime as needed. for sleep 07/06/17   [provider]    Family History No family history on file.  Social History Social History   Tobacco Use  . Smoking status: Former Smoker    Packs/day: 3.00    Years: 40.00    Pack years: 120.00    Types: Cigarettes    Last attempt to quit: 06/18/1999    Years since quitting: 18.6  . Smokeless tobacco: Never Used  Substance Use Topics  . Alcohol use: Not Currently  . Drug use: No     Allergies   Augmentin [amoxicillin-pot clavulanate]; Sulfa antibiotics; and Ambien [zolpidem  tartrate]   Review of Systems Review of Systems  Constitutional: Negative for activity change, fatigue and fever.  Respiratory: Negative for shortness of breath.   Cardiovascular: Negative for chest pain.  Gastrointestinal: Negative for abdominal pain.  Neurological: Negative for dizziness and light-headedness.  All other systems reviewed and are negative.    Physical Exam Updated Vital Signs BP (!) 133/50   Pulse 68   Temp (!) 97.5 F (36.4 C) (Oral)   Resp 18   Ht 5\' 6"  (1.676 m)   Wt 86.2 kg (190 lb)   SpO2 100%   BMI 30.67 kg/m   Physical Exam  Constitutional: He is oriented to person, place, and time. He appears well-nourished.  HENT:  Head: Normocephalic.  Eyes: Conjunctivae are normal. Right eye exhibits no discharge. Left eye exhibits no discharge.  Cardiovascular: Normal rate and regular rhythm.  No murmur heard. Pulmonary/Chest: Effort normal and  breath sounds normal. No respiratory distress.  Neurological: He is oriented to person, place, and time.  Sleepy but alert  Skin: Skin is warm and dry. He is not diaphoretic.  Psychiatric: He has a normal mood and affect. His behavior is normal.     ED Treatments / Results  Labs (all labs ordered are listed, but only abnormal results are displayed) Labs Reviewed  BASIC METABOLIC PANEL - Abnormal; Notable for the following components:      Result Value   Sodium 131 (*)    Potassium 5.8 (*)    Chloride 99 (*)    Glucose, Bld 558 (*)    BUN 41 (*)    Creatinine, Ser 2.05 (*)    GFR calc non Af Amer 31 (*)    GFR calc Af Amer 36 (*)    All other components within normal limits  CBC - Abnormal; Notable for the following components:   WBC 3.6 (*)    RBC 3.26 (*)    Hemoglobin 8.5 (*)    HCT 27.2 (*)    RDW 15.9 (*)    Platelets 76 (*)    All other components within normal limits  URINALYSIS, ROUTINE W REFLEX MICROSCOPIC - Abnormal; Notable for the following components:   Color, Urine STRAW (*)     Glucose, UA >=500 (*)    All other components within normal limits  CBG MONITORING, ED - Abnormal; Notable for the following components:   Glucose-Capillary 513 (*)    All other components within normal limits  COMPREHENSIVE METABOLIC PANEL  LIPASE, BLOOD  CBG MONITORING, ED  CBG MONITORING, ED    EKG None  Radiology No results found.  Procedures Procedures (including critical care time)  Medications Ordered in ED Medications  insulin aspart (novoLOG) injection 105 Units (has no administration in time range)     Initial Impression / Assessment and Plan / ED Course  I have reviewed the triage vital signs and the nursing notes.  Pertinent labs & imaging results that were available during my care of the patient were reviewed by me and considered in my medical decision making (see chart for details).     72 year old male past medical history significant for cirrhosis, esophageal varices, portal hypertension, diabetes, insulin-dependent, pancytopenia.  He is presenting today being told to come here by his gastroenterologist.  Gastroenterology did labs today found him to have sugar in the 500s and sent him to the ED for "fluids".  Patient already has insulin at home.  Patient had no infectious symptoms.  Of note patient has noticed that an increase in his sugars over the last several weeks to months.  Has been keeping detailed records and been slowly increasing his insulin as an outpatient with his primary care provider.  Patient's hemoglobin A1c went from 6 6 months ago to 11 more recently.  He is been having very poor control of his glucose.   7:29 PM Patient has normal vital signs.  No anion gap.  Labs look reassuring except for his elevated glucose.  It sound like this is been a very slow rise in his glucose over several months.  We will have him take his home insulin sliding scale which is 105 units of Humulin.  Will make sure that patient's blood sugar is going the right  direction.  However I do not think I would give patient a lot of  fluids right now given his portal hypertension and the fact that he is on a rather  precarious fluid curve.  Will plan to initiate just insulin.  We rechecked patient's sugar prior to insulin and it was 430.  Patient requested to go home and take his own insulin.  I think this is completely reasonable at this time.  Patient had no evidence of DKA, normal vital signs and appeared in his normal state health time of discharge.  Final Clinical Impressions(s) / ED Diagnoses   Final diagnoses:  None    ED Discharge Orders    None       Macarthur Critchley, MD 01/25/18 2330

## 2018-01-25 NOTE — ED Notes (Signed)
CBG 410 °

## 2018-01-25 NOTE — Discharge Instructions (Addendum)
Your labs did not show any evidence of DKA.  We think you just recurrently have hyperglycemia which means high blood sugar.  Please take your home insulin and continue to record your sugars and management with your primary care provider.  You are welcome back anytime with any complaints.  Was lovely to take care of you today.

## 2018-01-25 NOTE — ED Triage Notes (Signed)
Pt. Wife reports that he was seen by his doctor for cirrhosis check, had blood work done and was called and told his "sugar was over 500 and he needed to go to the hospital to get fluids and be admitted".

## 2018-02-01 DIAGNOSIS — N183 Chronic kidney disease, stage 3 (moderate): Secondary | ICD-10-CM | POA: Diagnosis not present

## 2018-02-01 DIAGNOSIS — K7460 Unspecified cirrhosis of liver: Secondary | ICD-10-CM | POA: Diagnosis not present

## 2018-02-01 DIAGNOSIS — Z5181 Encounter for therapeutic drug level monitoring: Secondary | ICD-10-CM | POA: Diagnosis not present

## 2018-02-01 DIAGNOSIS — E1165 Type 2 diabetes mellitus with hyperglycemia: Secondary | ICD-10-CM | POA: Diagnosis not present

## 2018-02-01 DIAGNOSIS — E1122 Type 2 diabetes mellitus with diabetic chronic kidney disease: Secondary | ICD-10-CM | POA: Diagnosis not present

## 2018-02-01 DIAGNOSIS — D509 Iron deficiency anemia, unspecified: Secondary | ICD-10-CM | POA: Diagnosis not present

## 2018-02-01 DIAGNOSIS — Z794 Long term (current) use of insulin: Secondary | ICD-10-CM | POA: Diagnosis not present

## 2018-02-08 DIAGNOSIS — R252 Cramp and spasm: Secondary | ICD-10-CM | POA: Diagnosis not present

## 2018-02-08 DIAGNOSIS — G4700 Insomnia, unspecified: Secondary | ICD-10-CM | POA: Diagnosis not present

## 2018-02-08 DIAGNOSIS — K7460 Unspecified cirrhosis of liver: Secondary | ICD-10-CM | POA: Diagnosis not present

## 2018-02-08 DIAGNOSIS — E7220 Disorder of urea cycle metabolism, unspecified: Secondary | ICD-10-CM | POA: Diagnosis not present

## 2018-02-08 DIAGNOSIS — E291 Testicular hypofunction: Secondary | ICD-10-CM | POA: Diagnosis not present

## 2018-02-22 DIAGNOSIS — E291 Testicular hypofunction: Secondary | ICD-10-CM | POA: Diagnosis not present

## 2018-03-08 DIAGNOSIS — E1122 Type 2 diabetes mellitus with diabetic chronic kidney disease: Secondary | ICD-10-CM | POA: Diagnosis not present

## 2018-03-08 DIAGNOSIS — E291 Testicular hypofunction: Secondary | ICD-10-CM | POA: Diagnosis not present

## 2018-03-08 DIAGNOSIS — D649 Anemia, unspecified: Secondary | ICD-10-CM | POA: Diagnosis not present

## 2018-03-08 DIAGNOSIS — E1165 Type 2 diabetes mellitus with hyperglycemia: Secondary | ICD-10-CM | POA: Diagnosis not present

## 2018-03-08 DIAGNOSIS — Z5181 Encounter for therapeutic drug level monitoring: Secondary | ICD-10-CM | POA: Diagnosis not present

## 2018-03-08 DIAGNOSIS — Z794 Long term (current) use of insulin: Secondary | ICD-10-CM | POA: Diagnosis not present

## 2018-03-08 DIAGNOSIS — N183 Chronic kidney disease, stage 3 (moderate): Secondary | ICD-10-CM | POA: Diagnosis not present

## 2018-03-08 DIAGNOSIS — K7460 Unspecified cirrhosis of liver: Secondary | ICD-10-CM | POA: Diagnosis not present

## 2018-03-08 DIAGNOSIS — R946 Abnormal results of thyroid function studies: Secondary | ICD-10-CM | POA: Diagnosis not present

## 2018-04-12 DIAGNOSIS — K7460 Unspecified cirrhosis of liver: Secondary | ICD-10-CM | POA: Diagnosis not present

## 2018-04-12 DIAGNOSIS — E291 Testicular hypofunction: Secondary | ICD-10-CM | POA: Diagnosis not present

## 2018-04-12 DIAGNOSIS — D649 Anemia, unspecified: Secondary | ICD-10-CM | POA: Diagnosis not present

## 2018-04-16 DIAGNOSIS — D509 Iron deficiency anemia, unspecified: Secondary | ICD-10-CM | POA: Diagnosis not present

## 2018-04-16 DIAGNOSIS — Z794 Long term (current) use of insulin: Secondary | ICD-10-CM | POA: Diagnosis not present

## 2018-04-16 DIAGNOSIS — E119 Type 2 diabetes mellitus without complications: Secondary | ICD-10-CM | POA: Diagnosis not present

## 2018-04-16 DIAGNOSIS — E782 Mixed hyperlipidemia: Secondary | ICD-10-CM | POA: Diagnosis not present

## 2018-04-16 DIAGNOSIS — I10 Essential (primary) hypertension: Secondary | ICD-10-CM | POA: Diagnosis not present

## 2018-04-16 DIAGNOSIS — N183 Chronic kidney disease, stage 3 (moderate): Secondary | ICD-10-CM | POA: Diagnosis not present

## 2018-04-16 DIAGNOSIS — E1165 Type 2 diabetes mellitus with hyperglycemia: Secondary | ICD-10-CM | POA: Diagnosis not present

## 2018-04-16 DIAGNOSIS — M19049 Primary osteoarthritis, unspecified hand: Secondary | ICD-10-CM | POA: Diagnosis not present

## 2018-04-16 DIAGNOSIS — F329 Major depressive disorder, single episode, unspecified: Secondary | ICD-10-CM | POA: Diagnosis not present

## 2018-04-16 DIAGNOSIS — E1140 Type 2 diabetes mellitus with diabetic neuropathy, unspecified: Secondary | ICD-10-CM | POA: Diagnosis not present

## 2018-04-16 DIAGNOSIS — S90415A Abrasion, left lesser toe(s), initial encounter: Secondary | ICD-10-CM | POA: Diagnosis not present

## 2018-04-16 DIAGNOSIS — D649 Anemia, unspecified: Secondary | ICD-10-CM | POA: Diagnosis not present

## 2018-04-17 DIAGNOSIS — Z86010 Personal history of colonic polyps: Secondary | ICD-10-CM | POA: Diagnosis not present

## 2018-04-17 DIAGNOSIS — K7460 Unspecified cirrhosis of liver: Secondary | ICD-10-CM | POA: Diagnosis not present

## 2018-04-17 DIAGNOSIS — D649 Anemia, unspecified: Secondary | ICD-10-CM | POA: Diagnosis not present

## 2018-04-17 DIAGNOSIS — Z23 Encounter for immunization: Secondary | ICD-10-CM | POA: Diagnosis not present

## 2018-05-01 DIAGNOSIS — E291 Testicular hypofunction: Secondary | ICD-10-CM | POA: Diagnosis not present

## 2018-05-09 DIAGNOSIS — Z5181 Encounter for therapeutic drug level monitoring: Secondary | ICD-10-CM | POA: Diagnosis not present

## 2018-05-09 DIAGNOSIS — E1122 Type 2 diabetes mellitus with diabetic chronic kidney disease: Secondary | ICD-10-CM | POA: Diagnosis not present

## 2018-05-09 DIAGNOSIS — K7460 Unspecified cirrhosis of liver: Secondary | ICD-10-CM | POA: Diagnosis not present

## 2018-05-09 DIAGNOSIS — Z794 Long term (current) use of insulin: Secondary | ICD-10-CM | POA: Diagnosis not present

## 2018-05-09 DIAGNOSIS — N183 Chronic kidney disease, stage 3 (moderate): Secondary | ICD-10-CM | POA: Diagnosis not present

## 2018-05-09 DIAGNOSIS — E1165 Type 2 diabetes mellitus with hyperglycemia: Secondary | ICD-10-CM | POA: Diagnosis not present

## 2018-05-15 DIAGNOSIS — D509 Iron deficiency anemia, unspecified: Secondary | ICD-10-CM | POA: Diagnosis not present

## 2018-05-15 DIAGNOSIS — N183 Chronic kidney disease, stage 3 (moderate): Secondary | ICD-10-CM | POA: Diagnosis not present

## 2018-05-15 DIAGNOSIS — I10 Essential (primary) hypertension: Secondary | ICD-10-CM | POA: Diagnosis not present

## 2018-05-15 DIAGNOSIS — D649 Anemia, unspecified: Secondary | ICD-10-CM | POA: Diagnosis not present

## 2018-05-15 DIAGNOSIS — F329 Major depressive disorder, single episode, unspecified: Secondary | ICD-10-CM | POA: Diagnosis not present

## 2018-05-15 DIAGNOSIS — E782 Mixed hyperlipidemia: Secondary | ICD-10-CM | POA: Diagnosis not present

## 2018-05-15 DIAGNOSIS — E1122 Type 2 diabetes mellitus with diabetic chronic kidney disease: Secondary | ICD-10-CM | POA: Diagnosis not present

## 2018-05-15 DIAGNOSIS — M19049 Primary osteoarthritis, unspecified hand: Secondary | ICD-10-CM | POA: Diagnosis not present

## 2018-05-24 DIAGNOSIS — E291 Testicular hypofunction: Secondary | ICD-10-CM | POA: Diagnosis not present

## 2018-05-24 DIAGNOSIS — E538 Deficiency of other specified B group vitamins: Secondary | ICD-10-CM | POA: Diagnosis not present

## 2018-05-28 DIAGNOSIS — Z6830 Body mass index (BMI) 30.0-30.9, adult: Secondary | ICD-10-CM | POA: Diagnosis not present

## 2018-05-28 DIAGNOSIS — D649 Anemia, unspecified: Secondary | ICD-10-CM | POA: Diagnosis not present

## 2018-05-28 DIAGNOSIS — N183 Chronic kidney disease, stage 3 (moderate): Secondary | ICD-10-CM | POA: Diagnosis not present

## 2018-05-28 DIAGNOSIS — I150 Renovascular hypertension: Secondary | ICD-10-CM | POA: Diagnosis not present

## 2018-05-28 DIAGNOSIS — D631 Anemia in chronic kidney disease: Secondary | ICD-10-CM | POA: Diagnosis not present

## 2018-05-29 DIAGNOSIS — M545 Low back pain: Secondary | ICD-10-CM | POA: Diagnosis not present

## 2018-05-31 ENCOUNTER — Other Ambulatory Visit: Payer: Self-pay | Admitting: Gastroenterology

## 2018-05-31 DIAGNOSIS — K7460 Unspecified cirrhosis of liver: Secondary | ICD-10-CM

## 2018-06-07 DIAGNOSIS — E291 Testicular hypofunction: Secondary | ICD-10-CM | POA: Diagnosis not present

## 2018-06-07 DIAGNOSIS — R252 Cramp and spasm: Secondary | ICD-10-CM | POA: Diagnosis not present

## 2018-06-08 ENCOUNTER — Ambulatory Visit
Admission: RE | Admit: 2018-06-08 | Discharge: 2018-06-08 | Disposition: A | Discharge status: Home / Self Care | Payer: Medicare Other | Source: Ambulatory Visit | Attending: Gastroenterology | Admitting: Gastroenterology

## 2018-06-08 DIAGNOSIS — K7460 Unspecified cirrhosis of liver: Secondary | ICD-10-CM

## 2018-06-12 ENCOUNTER — Other Ambulatory Visit (HOSPITAL_COMMUNITY): Payer: Self-pay | Admitting: *Deleted

## 2018-06-13 ENCOUNTER — Ambulatory Visit (HOSPITAL_COMMUNITY)
Admission: RE | Admit: 2018-06-13 | Discharge: 2018-06-13 | Disposition: A | Discharge status: Home / Self Care | Payer: Medicare Other | Source: Ambulatory Visit | Attending: Nephrology | Admitting: Nephrology

## 2018-06-13 DIAGNOSIS — D649 Anemia, unspecified: Secondary | ICD-10-CM | POA: Insufficient documentation

## 2018-06-13 MED ORDER — SODIUM CHLORIDE 0.9 % IV SOLN
510.0000 mg | INTRAVENOUS | Status: DC
  Administered 2018-06-13: 510 mg via INTRAVENOUS
  Filled 2018-06-13: qty 17

## 2018-06-14 DIAGNOSIS — E1165 Type 2 diabetes mellitus with hyperglycemia: Secondary | ICD-10-CM | POA: Diagnosis not present

## 2018-06-14 DIAGNOSIS — Z79899 Other long term (current) drug therapy: Secondary | ICD-10-CM | POA: Diagnosis not present

## 2018-06-14 DIAGNOSIS — Z794 Long term (current) use of insulin: Secondary | ICD-10-CM | POA: Diagnosis not present

## 2018-06-14 DIAGNOSIS — K7460 Unspecified cirrhosis of liver: Secondary | ICD-10-CM | POA: Diagnosis not present

## 2018-06-14 DIAGNOSIS — N183 Chronic kidney disease, stage 3 (moderate): Secondary | ICD-10-CM | POA: Diagnosis not present

## 2018-06-14 DIAGNOSIS — E1122 Type 2 diabetes mellitus with diabetic chronic kidney disease: Secondary | ICD-10-CM | POA: Diagnosis not present

## 2018-06-18 DIAGNOSIS — E1140 Type 2 diabetes mellitus with diabetic neuropathy, unspecified: Secondary | ICD-10-CM | POA: Diagnosis not present

## 2018-06-18 DIAGNOSIS — G4733 Obstructive sleep apnea (adult) (pediatric): Secondary | ICD-10-CM | POA: Diagnosis not present

## 2018-06-18 DIAGNOSIS — E538 Deficiency of other specified B group vitamins: Secondary | ICD-10-CM | POA: Diagnosis not present

## 2018-06-18 DIAGNOSIS — I739 Peripheral vascular disease, unspecified: Secondary | ICD-10-CM | POA: Diagnosis not present

## 2018-06-18 DIAGNOSIS — K219 Gastro-esophageal reflux disease without esophagitis: Secondary | ICD-10-CM | POA: Diagnosis not present

## 2018-06-18 DIAGNOSIS — K760 Fatty (change of) liver, not elsewhere classified: Secondary | ICD-10-CM | POA: Diagnosis not present

## 2018-06-18 DIAGNOSIS — N183 Chronic kidney disease, stage 3 (moderate): Secondary | ICD-10-CM | POA: Diagnosis not present

## 2018-06-18 DIAGNOSIS — Z0000 Encounter for general adult medical examination without abnormal findings: Secondary | ICD-10-CM | POA: Diagnosis not present

## 2018-06-18 DIAGNOSIS — E559 Vitamin D deficiency, unspecified: Secondary | ICD-10-CM | POA: Diagnosis not present

## 2018-06-18 DIAGNOSIS — I779 Disorder of arteries and arterioles, unspecified: Secondary | ICD-10-CM | POA: Diagnosis not present

## 2018-06-18 DIAGNOSIS — E1122 Type 2 diabetes mellitus with diabetic chronic kidney disease: Secondary | ICD-10-CM | POA: Diagnosis not present

## 2018-06-18 DIAGNOSIS — D509 Iron deficiency anemia, unspecified: Secondary | ICD-10-CM | POA: Diagnosis not present

## 2018-06-20 ENCOUNTER — Ambulatory Visit (HOSPITAL_COMMUNITY)
Admission: RE | Admit: 2018-06-20 | Discharge: 2018-06-20 | Disposition: A | Discharge status: Home / Self Care | Payer: Medicare Other | Source: Ambulatory Visit | Attending: Nephrology | Admitting: Nephrology

## 2018-06-20 DIAGNOSIS — R188 Other ascites: Secondary | ICD-10-CM | POA: Diagnosis not present

## 2018-06-20 DIAGNOSIS — D631 Anemia in chronic kidney disease: Secondary | ICD-10-CM | POA: Insufficient documentation

## 2018-06-20 DIAGNOSIS — N189 Chronic kidney disease, unspecified: Secondary | ICD-10-CM | POA: Diagnosis not present

## 2018-06-20 DIAGNOSIS — K7460 Unspecified cirrhosis of liver: Secondary | ICD-10-CM | POA: Diagnosis not present

## 2018-06-20 DIAGNOSIS — K7290 Hepatic failure, unspecified without coma: Secondary | ICD-10-CM | POA: Diagnosis not present

## 2018-06-20 DIAGNOSIS — N183 Chronic kidney disease, stage 3 (moderate): Secondary | ICD-10-CM | POA: Diagnosis not present

## 2018-06-20 MED ORDER — SODIUM CHLORIDE 0.9 % IV SOLN
510.0000 mg | INTRAVENOUS | Status: AC
  Administered 2018-06-20: 510 mg via INTRAVENOUS
  Filled 2018-06-20: qty 17

## 2018-06-21 ENCOUNTER — Other Ambulatory Visit: Payer: Self-pay | Admitting: Internal Medicine

## 2018-06-21 DIAGNOSIS — E538 Deficiency of other specified B group vitamins: Secondary | ICD-10-CM | POA: Diagnosis not present

## 2018-06-21 DIAGNOSIS — E291 Testicular hypofunction: Secondary | ICD-10-CM | POA: Diagnosis not present

## 2018-06-21 DIAGNOSIS — I779 Disorder of arteries and arterioles, unspecified: Secondary | ICD-10-CM

## 2018-06-21 DIAGNOSIS — I739 Peripheral vascular disease, unspecified: Principal | ICD-10-CM

## 2018-06-27 ENCOUNTER — Ambulatory Visit
Admission: RE | Admit: 2018-06-27 | Discharge: 2018-06-27 | Disposition: A | Discharge status: Home / Self Care | Payer: Medicare Other | Source: Ambulatory Visit | Attending: Internal Medicine | Admitting: Internal Medicine

## 2018-06-27 DIAGNOSIS — I739 Peripheral vascular disease, unspecified: Principal | ICD-10-CM

## 2018-06-27 DIAGNOSIS — I779 Disorder of arteries and arterioles, unspecified: Secondary | ICD-10-CM

## 2018-07-02 ENCOUNTER — Other Ambulatory Visit: Payer: Self-pay

## 2018-07-02 DIAGNOSIS — K7460 Unspecified cirrhosis of liver: Secondary | ICD-10-CM | POA: Diagnosis not present

## 2018-07-02 DIAGNOSIS — I6523 Occlusion and stenosis of bilateral carotid arteries: Secondary | ICD-10-CM

## 2018-07-03 ENCOUNTER — Encounter: Payer: Self-pay | Admitting: Vascular Surgery

## 2018-07-03 ENCOUNTER — Ambulatory Visit (INDEPENDENT_AMBULATORY_CARE_PROVIDER_SITE_OTHER): Payer: Medicare Other | Admitting: Vascular Surgery

## 2018-07-03 ENCOUNTER — Encounter: Payer: Medicare Other | Admitting: Vascular Surgery

## 2018-07-03 ENCOUNTER — Ambulatory Visit (HOSPITAL_COMMUNITY)
Admission: RE | Admit: 2018-07-03 | Discharge: 2018-07-03 | Disposition: A | Discharge status: Home / Self Care | Payer: Medicare Other | Source: Ambulatory Visit | Attending: Vascular Surgery | Admitting: Vascular Surgery

## 2018-07-03 ENCOUNTER — Other Ambulatory Visit: Payer: Self-pay

## 2018-07-03 VITALS — BP 122/66 | HR 69 | Resp 18 | Ht 66.0 in | Wt 200.0 lb

## 2018-07-03 DIAGNOSIS — I6529 Occlusion and stenosis of unspecified carotid artery: Secondary | ICD-10-CM | POA: Diagnosis not present

## 2018-07-03 DIAGNOSIS — I6523 Occlusion and stenosis of bilateral carotid arteries: Secondary | ICD-10-CM | POA: Diagnosis not present

## 2018-07-03 NOTE — Progress Notes (Signed)
[facility] [place]  Patient name: Javier Potter MRN: [id number] DOB: [birth year] Sex: male  REASON FOR VISIT: Valuation carotid stenosis  HPI: Javier Potter is a 72 y.o. male well-known to me from right carotid endarterectomy 2 years ago.  He recently underwent carotid duplex at [facility] imaging and was felt to have a high-grade left carotid stenosis.  Our prior evaluations with him showed no evidence of significant left carotid disease.  Denies any neurologic deficits specifically no amaurosis fugax, transient ischemic attack or stroke.  Past Medical History:  Diagnosis Date  . Anemia    hx  . Arthritis    "hands" (06/22/2016)  . Carotid stenosis, right   . CHF (congestive heart failure) ([place])   . Chronic kidney disease (CKD), stage III (moderate) (HCC)   . Cirrhosis, cryptogenic ([place]) 07/04/2017  . GERD (gastroesophageal reflux disease)    under control  . High cholesterol   . Hypertension   . Murmur    had since childhood-no problems  . Pancytopenia ([place]) 07/04/2017  . Sleep apnea    "can't tolerate mask" (06/22/2016)  . Type II diabetes mellitus (HCC)     Family History  Problem Relation Age of Onset  . Heart disease Father   . AAA (abdominal aortic aneurysm) Sister     SOCIAL HISTORY: Social History   Tobacco Use  . Smoking status: Former Smoker    Packs/day: 3.00    Years: 40.00    Pack years: 120.00    Types: Cigarettes    Last attempt to quit: 06/18/1999    Years since quitting: 19.0  . Smokeless tobacco: Never Used  Substance Use Topics  . Alcohol use: Not Currently    Allergies  Allergen Reactions  . Augmentin [Amoxicillin-Pot Clavulanate] Swelling and Other (See Comments)    Tongue swells Patient states has tolerated amoxicillin  . Sulfa Antibiotics Swelling and Other (See Comments)    Tongue swells  . Ambien [Zolpidem Tartrate] Other (See Comments)    "did not sit well with  system..made me go crazy"  . Canagliflozin Other (See Comments)    Leg cramps     Current Outpatient Medications  Medication Sig Dispense Refill  . aspirin EC 81 MG tablet Take 81 mg by mouth at bedtime.    . Cholecalciferol (VITAMIN D) 2000 UNITS CAPS Take 2,000 Units by mouth every morning.     . cyanocobalamin (,VITAMIN B-12,) 1000 MCG/ML injection Inject 1,000 mcg into the muscle every 30 (thirty) days.    . Ferrous Sulfate (IRON) 325 (65 FE) MG TABS Take 325 mg by mouth 2 (two) times daily.     . fluticasone (FLONASE) 50 MCG/ACT nasal spray Place 1 spray into both nostrils 2 (two) times daily as needed for allergies or rhinitis.   2  . furosemide (LASIX) 20 MG tablet Take 20 mg by mouth daily.  5  . glucosamine-chondroitin 500-400 MG tablet Take 2 tablets by mouth daily.    . insulin aspart (NOVOLOG) 100 UNIT/ML injection Inject 0-40 Units into the skin 3 (three) times daily with meals. Per sliding scale    . LANTUS SOLOSTAR 100 UNIT/ML Solostar Pen INJECT 30 UNITS WITH EACH MEAL 3 TIMES A DAY  3  . lisinopril (PRINIVIL,ZESTRIL) 20 MG tablet Take 20 mg by mouth at bedtime.    . metoprolol succinate (TOPROL-XL) 50 MG 24 hr tablet Take 50 mg by mouth every morning. Take with or immediately following a meal.    .  NOVOLOG FLEXPEN 100 UNIT/ML FlexPen INJECT 30 TO 60 UNITS UNDER THE SKIN TWICE DAILY WITH MEALS  1  . omeprazole (PRILOSEC) 20 MG capsule Take 20 mg by mouth 2 (two) times daily before a meal.     . psyllium (METAMUCIL) 58.6 % powder Take 1 packet by mouth daily.     Marland Kitchen SHINGRIX injection TO BE ADMINISTERED BY PHARMACIST FOR IMMUNIZATION  0  . sildenafil (REVATIO) 20 MG tablet Take 20 mg by mouth as needed (for ED).     Marland Kitchen spironolactone (ALDACTONE) 50 MG tablet Take 50 mg by mouth 2 (two) times daily with a meal.  5  . temazepam (RESTORIL) 15 MG capsule Take 15 mg by mouth at bedtime as needed. for sleep  1   No current facility-administered medications for this visit.      REVIEW OF SYSTEMS:  [X]  denotes positive finding, [ ]  denotes negative finding Cardiac  Comments:  Chest pain or chest pressure:    Shortness of breath upon exertion:    Short of breath when lying flat:    Irregular heart rhythm:        Vascular    Pain in calf, thigh, or hip brought on by ambulation:    Pain in feet at night that wakes you up from your sleep:     Blood clot in your veins:    Leg swelling:           PHYSICAL EXAM: Vitals:   07/03/18 1427 07/03/18 1428  BP: 125/65 122/66  Pulse: 69   Resp: 18   SpO2: 98%   Weight: 200 lb (90.7 kg)   Height: 5\' 6"  (1.676 m)     GENERAL: The patient is a well-nourished male, in no acute distress. The vital signs are documented above. CARDIOVASCULAR: Harsh left carotid bruit and no bruit on the right.  Well-healed right carotid incision.  2+ radial pulses bilaterally PULMONARY: There is good air exchange  MUSCULOSKELETAL: There are no major deformities or cyanosis. NEUROLOGIC: No focal weakness or paresthesias are detected. SKIN: There are no ulcers or rashes noted. PSYCHIATRIC: The patient has a normal affect.  DATA:  I reviewed his carotid duplex from [facility] from 06/27/2018.  This did suggest a high-grade left carotid stenosis.  He did not have elevated end-diastolic velocities.  We repeated his duplex today and this does not show any evidence of significant left carotid stenosis.  He does have a high-grade stenosis at the takeoff of his external carotid artery.  MEDICAL ISSUES: Discussed this at length with the patient and his wife present.  I feel that the study at [facility] imaging was an over estimation of his degree of narrowing.  I recommend that we see him again in 6 months with repeat carotid duplex to confirm this.  He will notify should he develop any neurologic deficits or other signs of carotid disease.    Rosetta Posner, MD FACS [facility] Tel [phone] Pager [phone]

## 2018-07-05 DIAGNOSIS — E291 Testicular hypofunction: Secondary | ICD-10-CM | POA: Diagnosis not present

## 2018-07-19 DIAGNOSIS — M19049 Primary osteoarthritis, unspecified hand: Secondary | ICD-10-CM | POA: Diagnosis not present

## 2018-07-19 DIAGNOSIS — E782 Mixed hyperlipidemia: Secondary | ICD-10-CM | POA: Diagnosis not present

## 2018-07-19 DIAGNOSIS — E1140 Type 2 diabetes mellitus with diabetic neuropathy, unspecified: Secondary | ICD-10-CM | POA: Diagnosis not present

## 2018-07-19 DIAGNOSIS — F329 Major depressive disorder, single episode, unspecified: Secondary | ICD-10-CM | POA: Diagnosis not present

## 2018-07-19 DIAGNOSIS — D509 Iron deficiency anemia, unspecified: Secondary | ICD-10-CM | POA: Diagnosis not present

## 2018-07-19 DIAGNOSIS — N183 Chronic kidney disease, stage 3 (moderate): Secondary | ICD-10-CM | POA: Diagnosis not present

## 2018-07-19 DIAGNOSIS — E1121 Type 2 diabetes mellitus with diabetic nephropathy: Secondary | ICD-10-CM | POA: Diagnosis not present

## 2018-07-19 DIAGNOSIS — E291 Testicular hypofunction: Secondary | ICD-10-CM | POA: Diagnosis not present

## 2018-07-19 DIAGNOSIS — I10 Essential (primary) hypertension: Secondary | ICD-10-CM | POA: Diagnosis not present

## 2018-07-19 DIAGNOSIS — D649 Anemia, unspecified: Secondary | ICD-10-CM | POA: Diagnosis not present

## 2018-07-19 DIAGNOSIS — E1122 Type 2 diabetes mellitus with diabetic chronic kidney disease: Secondary | ICD-10-CM | POA: Diagnosis not present

## 2018-08-02 DIAGNOSIS — E291 Testicular hypofunction: Secondary | ICD-10-CM | POA: Diagnosis not present

## 2018-08-13 DIAGNOSIS — N183 Chronic kidney disease, stage 3 (moderate): Secondary | ICD-10-CM | POA: Diagnosis not present

## 2018-08-13 DIAGNOSIS — N189 Chronic kidney disease, unspecified: Secondary | ICD-10-CM | POA: Diagnosis not present

## 2018-08-16 DIAGNOSIS — E291 Testicular hypofunction: Secondary | ICD-10-CM | POA: Diagnosis not present

## 2018-08-27 DIAGNOSIS — E1121 Type 2 diabetes mellitus with diabetic nephropathy: Secondary | ICD-10-CM | POA: Diagnosis not present

## 2018-08-27 DIAGNOSIS — I10 Essential (primary) hypertension: Secondary | ICD-10-CM | POA: Diagnosis not present

## 2018-08-27 DIAGNOSIS — D509 Iron deficiency anemia, unspecified: Secondary | ICD-10-CM | POA: Diagnosis not present

## 2018-08-27 DIAGNOSIS — N183 Chronic kidney disease, stage 3 (moderate): Secondary | ICD-10-CM | POA: Diagnosis not present

## 2018-08-27 DIAGNOSIS — M19049 Primary osteoarthritis, unspecified hand: Secondary | ICD-10-CM | POA: Diagnosis not present

## 2018-08-27 DIAGNOSIS — E782 Mixed hyperlipidemia: Secondary | ICD-10-CM | POA: Diagnosis not present

## 2018-08-27 DIAGNOSIS — F329 Major depressive disorder, single episode, unspecified: Secondary | ICD-10-CM | POA: Diagnosis not present

## 2018-08-27 DIAGNOSIS — E1122 Type 2 diabetes mellitus with diabetic chronic kidney disease: Secondary | ICD-10-CM | POA: Diagnosis not present

## 2018-08-27 DIAGNOSIS — E1140 Type 2 diabetes mellitus with diabetic neuropathy, unspecified: Secondary | ICD-10-CM | POA: Diagnosis not present

## 2018-08-27 DIAGNOSIS — D649 Anemia, unspecified: Secondary | ICD-10-CM | POA: Diagnosis not present

## 2018-08-30 ENCOUNTER — Other Ambulatory Visit (HOSPITAL_COMMUNITY): Payer: Self-pay | Admitting: *Deleted

## 2018-08-30 DIAGNOSIS — E538 Deficiency of other specified B group vitamins: Secondary | ICD-10-CM | POA: Diagnosis not present

## 2018-08-30 DIAGNOSIS — E291 Testicular hypofunction: Secondary | ICD-10-CM | POA: Diagnosis not present

## 2018-08-31 ENCOUNTER — Encounter (HOSPITAL_COMMUNITY)
Admission: RE | Admit: 2018-08-31 | Discharge: 2018-08-31 | Disposition: A | Discharge status: Home / Self Care | Payer: Medicare Other | Source: Ambulatory Visit | Attending: Nephrology | Admitting: Nephrology

## 2018-08-31 DIAGNOSIS — N189 Chronic kidney disease, unspecified: Secondary | ICD-10-CM | POA: Diagnosis not present

## 2018-08-31 DIAGNOSIS — D631 Anemia in chronic kidney disease: Secondary | ICD-10-CM | POA: Insufficient documentation

## 2018-08-31 MED ORDER — SODIUM CHLORIDE 0.9 % IV SOLN
510.0000 mg | INTRAVENOUS | Status: DC
  Administered 2018-08-31: 510 mg via INTRAVENOUS
  Filled 2018-08-31: qty 510

## 2018-09-04 DIAGNOSIS — Z86010 Personal history of colonic polyps: Secondary | ICD-10-CM | POA: Diagnosis not present

## 2018-09-04 DIAGNOSIS — K7460 Unspecified cirrhosis of liver: Secondary | ICD-10-CM | POA: Diagnosis not present

## 2018-09-07 ENCOUNTER — Ambulatory Visit (HOSPITAL_COMMUNITY)
Admission: RE | Admit: 2018-09-07 | Discharge: 2018-09-07 | Disposition: A | Discharge status: Home / Self Care | Payer: Medicare Other | Source: Ambulatory Visit | Attending: Nephrology | Admitting: Nephrology

## 2018-09-07 DIAGNOSIS — D631 Anemia in chronic kidney disease: Secondary | ICD-10-CM | POA: Diagnosis not present

## 2018-09-07 DIAGNOSIS — N189 Chronic kidney disease, unspecified: Secondary | ICD-10-CM | POA: Diagnosis not present

## 2018-09-07 MED ORDER — SODIUM CHLORIDE 0.9 % IV SOLN
510.0000 mg | INTRAVENOUS | Status: DC
  Administered 2018-09-07: 510 mg via INTRAVENOUS
  Filled 2018-09-07: qty 510

## 2018-09-11 DIAGNOSIS — M150 Primary generalized (osteo)arthritis: Secondary | ICD-10-CM | POA: Diagnosis not present

## 2018-09-11 DIAGNOSIS — G894 Chronic pain syndrome: Secondary | ICD-10-CM | POA: Diagnosis not present

## 2018-09-11 DIAGNOSIS — M47816 Spondylosis without myelopathy or radiculopathy, lumbar region: Secondary | ICD-10-CM | POA: Diagnosis not present

## 2018-09-13 ENCOUNTER — Ambulatory Visit
Admission: RE | Admit: 2018-09-13 | Discharge: 2018-09-13 | Disposition: A | Discharge status: Home / Self Care | Payer: Medicare Other | Source: Ambulatory Visit | Attending: Anesthesiology | Admitting: Anesthesiology

## 2018-09-13 ENCOUNTER — Other Ambulatory Visit: Payer: Self-pay | Admitting: Anesthesiology

## 2018-09-13 DIAGNOSIS — M545 Low back pain, unspecified: Secondary | ICD-10-CM

## 2018-09-13 DIAGNOSIS — M5136 Other intervertebral disc degeneration, lumbar region: Secondary | ICD-10-CM | POA: Diagnosis not present

## 2018-09-13 DIAGNOSIS — E291 Testicular hypofunction: Secondary | ICD-10-CM | POA: Diagnosis not present

## 2018-09-18 DIAGNOSIS — K7460 Unspecified cirrhosis of liver: Secondary | ICD-10-CM | POA: Diagnosis not present

## 2018-09-18 DIAGNOSIS — E1165 Type 2 diabetes mellitus with hyperglycemia: Secondary | ICD-10-CM | POA: Diagnosis not present

## 2018-09-18 DIAGNOSIS — N183 Chronic kidney disease, stage 3 (moderate): Secondary | ICD-10-CM | POA: Diagnosis not present

## 2018-09-18 DIAGNOSIS — Z794 Long term (current) use of insulin: Secondary | ICD-10-CM | POA: Diagnosis not present

## 2018-09-18 DIAGNOSIS — E1122 Type 2 diabetes mellitus with diabetic chronic kidney disease: Secondary | ICD-10-CM | POA: Diagnosis not present

## 2018-09-18 DIAGNOSIS — Z5181 Encounter for therapeutic drug level monitoring: Secondary | ICD-10-CM | POA: Diagnosis not present

## 2018-10-01 DIAGNOSIS — K7460 Unspecified cirrhosis of liver: Secondary | ICD-10-CM | POA: Diagnosis not present

## 2018-10-04 DIAGNOSIS — E291 Testicular hypofunction: Secondary | ICD-10-CM | POA: Diagnosis not present

## 2018-10-04 DIAGNOSIS — E538 Deficiency of other specified B group vitamins: Secondary | ICD-10-CM | POA: Diagnosis not present

## 2018-10-08 DIAGNOSIS — N189 Chronic kidney disease, unspecified: Secondary | ICD-10-CM | POA: Diagnosis not present

## 2018-10-09 DIAGNOSIS — M150 Primary generalized (osteo)arthritis: Secondary | ICD-10-CM | POA: Diagnosis not present

## 2018-10-09 DIAGNOSIS — M47816 Spondylosis without myelopathy or radiculopathy, lumbar region: Secondary | ICD-10-CM | POA: Diagnosis not present

## 2018-10-09 DIAGNOSIS — G894 Chronic pain syndrome: Secondary | ICD-10-CM | POA: Diagnosis not present

## 2018-10-09 DIAGNOSIS — E119 Type 2 diabetes mellitus without complications: Secondary | ICD-10-CM | POA: Diagnosis not present

## 2018-10-17 ENCOUNTER — Other Ambulatory Visit (HOSPITAL_COMMUNITY): Payer: Self-pay | Admitting: *Deleted

## 2018-10-18 ENCOUNTER — Ambulatory Visit (HOSPITAL_COMMUNITY)
Admission: RE | Admit: 2018-10-18 | Discharge: 2018-10-18 | Disposition: A | Discharge status: Home / Self Care | Payer: Medicare Other | Source: Ambulatory Visit | Attending: Nephrology | Admitting: Nephrology

## 2018-10-18 DIAGNOSIS — N189 Chronic kidney disease, unspecified: Secondary | ICD-10-CM | POA: Diagnosis not present

## 2018-10-18 DIAGNOSIS — D631 Anemia in chronic kidney disease: Secondary | ICD-10-CM | POA: Insufficient documentation

## 2018-10-18 DIAGNOSIS — E291 Testicular hypofunction: Secondary | ICD-10-CM | POA: Diagnosis not present

## 2018-10-18 MED ORDER — SODIUM CHLORIDE 0.9 % IV SOLN
510.0000 mg | INTRAVENOUS | Status: DC
  Administered 2018-10-18: 510 mg via INTRAVENOUS
  Filled 2018-10-18: qty 510

## 2018-10-18 NOTE — Discharge Instructions (Signed)

## 2018-10-25 ENCOUNTER — Ambulatory Visit (HOSPITAL_COMMUNITY)
Admission: RE | Admit: 2018-10-25 | Discharge: 2018-10-25 | Disposition: A | Discharge status: Home / Self Care | Payer: Medicare Other | Source: Ambulatory Visit | Attending: Nephrology | Admitting: Nephrology

## 2018-10-25 DIAGNOSIS — D631 Anemia in chronic kidney disease: Secondary | ICD-10-CM | POA: Insufficient documentation

## 2018-10-25 MED ORDER — SODIUM CHLORIDE 0.9 % IV SOLN
510.0000 mg | INTRAVENOUS | Status: DC
  Administered 2018-10-25: 510 mg via INTRAVENOUS
  Filled 2018-10-25: qty 510

## 2018-11-01 DIAGNOSIS — L299 Pruritus, unspecified: Secondary | ICD-10-CM | POA: Diagnosis not present

## 2018-11-01 DIAGNOSIS — N184 Chronic kidney disease, stage 4 (severe): Secondary | ICD-10-CM | POA: Diagnosis not present

## 2018-11-08 DIAGNOSIS — E291 Testicular hypofunction: Secondary | ICD-10-CM | POA: Diagnosis not present

## 2018-11-08 DIAGNOSIS — N184 Chronic kidney disease, stage 4 (severe): Secondary | ICD-10-CM | POA: Diagnosis not present

## 2018-11-08 DIAGNOSIS — E538 Deficiency of other specified B group vitamins: Secondary | ICD-10-CM | POA: Diagnosis not present

## 2018-11-08 DIAGNOSIS — Z789 Other specified health status: Secondary | ICD-10-CM | POA: Diagnosis not present

## 2018-11-08 DIAGNOSIS — L299 Pruritus, unspecified: Secondary | ICD-10-CM | POA: Diagnosis not present

## 2018-11-15 DIAGNOSIS — L299 Pruritus, unspecified: Secondary | ICD-10-CM | POA: Diagnosis not present

## 2018-11-22 DIAGNOSIS — D509 Iron deficiency anemia, unspecified: Secondary | ICD-10-CM | POA: Diagnosis not present

## 2018-11-22 DIAGNOSIS — I10 Essential (primary) hypertension: Secondary | ICD-10-CM | POA: Diagnosis not present

## 2018-11-22 DIAGNOSIS — D649 Anemia, unspecified: Secondary | ICD-10-CM | POA: Diagnosis not present

## 2018-11-22 DIAGNOSIS — F329 Major depressive disorder, single episode, unspecified: Secondary | ICD-10-CM | POA: Diagnosis not present

## 2018-11-22 DIAGNOSIS — E1121 Type 2 diabetes mellitus with diabetic nephropathy: Secondary | ICD-10-CM | POA: Diagnosis not present

## 2018-11-22 DIAGNOSIS — E782 Mixed hyperlipidemia: Secondary | ICD-10-CM | POA: Diagnosis not present

## 2018-11-22 DIAGNOSIS — E1140 Type 2 diabetes mellitus with diabetic neuropathy, unspecified: Secondary | ICD-10-CM | POA: Diagnosis not present

## 2018-11-22 DIAGNOSIS — N184 Chronic kidney disease, stage 4 (severe): Secondary | ICD-10-CM | POA: Diagnosis not present

## 2018-11-22 DIAGNOSIS — E1122 Type 2 diabetes mellitus with diabetic chronic kidney disease: Secondary | ICD-10-CM | POA: Diagnosis not present

## 2018-11-22 DIAGNOSIS — M19049 Primary osteoarthritis, unspecified hand: Secondary | ICD-10-CM | POA: Diagnosis not present

## 2018-11-22 DIAGNOSIS — N183 Chronic kidney disease, stage 3 (moderate): Secondary | ICD-10-CM | POA: Diagnosis not present

## 2018-11-22 DIAGNOSIS — E291 Testicular hypofunction: Secondary | ICD-10-CM | POA: Diagnosis not present

## 2018-11-27 DIAGNOSIS — L299 Pruritus, unspecified: Secondary | ICD-10-CM | POA: Diagnosis not present

## 2018-12-03 DIAGNOSIS — L299 Pruritus, unspecified: Secondary | ICD-10-CM | POA: Diagnosis not present

## 2018-12-03 DIAGNOSIS — Z789 Other specified health status: Secondary | ICD-10-CM | POA: Diagnosis not present

## 2018-12-03 DIAGNOSIS — E291 Testicular hypofunction: Secondary | ICD-10-CM | POA: Diagnosis not present

## 2018-12-03 DIAGNOSIS — N184 Chronic kidney disease, stage 4 (severe): Secondary | ICD-10-CM | POA: Diagnosis not present

## 2018-12-03 DIAGNOSIS — E538 Deficiency of other specified B group vitamins: Secondary | ICD-10-CM | POA: Diagnosis not present

## 2018-12-06 DIAGNOSIS — E291 Testicular hypofunction: Secondary | ICD-10-CM | POA: Diagnosis not present

## 2018-12-06 DIAGNOSIS — E538 Deficiency of other specified B group vitamins: Secondary | ICD-10-CM | POA: Diagnosis not present

## 2018-12-06 DIAGNOSIS — N184 Chronic kidney disease, stage 4 (severe): Secondary | ICD-10-CM | POA: Diagnosis not present

## 2018-12-20 DIAGNOSIS — N183 Chronic kidney disease, stage 3 (moderate): Secondary | ICD-10-CM | POA: Diagnosis not present

## 2018-12-20 DIAGNOSIS — Z794 Long term (current) use of insulin: Secondary | ICD-10-CM | POA: Diagnosis not present

## 2018-12-20 DIAGNOSIS — E782 Mixed hyperlipidemia: Secondary | ICD-10-CM | POA: Diagnosis not present

## 2018-12-20 DIAGNOSIS — K7460 Unspecified cirrhosis of liver: Secondary | ICD-10-CM | POA: Diagnosis not present

## 2018-12-20 DIAGNOSIS — E291 Testicular hypofunction: Secondary | ICD-10-CM | POA: Diagnosis not present

## 2018-12-20 DIAGNOSIS — E1165 Type 2 diabetes mellitus with hyperglycemia: Secondary | ICD-10-CM | POA: Diagnosis not present

## 2018-12-20 DIAGNOSIS — Z5181 Encounter for therapeutic drug level monitoring: Secondary | ICD-10-CM | POA: Diagnosis not present

## 2018-12-20 DIAGNOSIS — E1122 Type 2 diabetes mellitus with diabetic chronic kidney disease: Secondary | ICD-10-CM | POA: Diagnosis not present

## 2019-01-03 DIAGNOSIS — E291 Testicular hypofunction: Secondary | ICD-10-CM | POA: Diagnosis not present

## 2019-01-03 DIAGNOSIS — E538 Deficiency of other specified B group vitamins: Secondary | ICD-10-CM | POA: Diagnosis not present

## 2019-01-17 DIAGNOSIS — E291 Testicular hypofunction: Secondary | ICD-10-CM | POA: Diagnosis not present

## 2019-01-25 DIAGNOSIS — L299 Pruritus, unspecified: Secondary | ICD-10-CM | POA: Diagnosis not present

## 2019-01-25 DIAGNOSIS — D631 Anemia in chronic kidney disease: Secondary | ICD-10-CM | POA: Diagnosis not present

## 2019-01-25 DIAGNOSIS — N183 Chronic kidney disease, stage 3 (moderate): Secondary | ICD-10-CM | POA: Diagnosis not present

## 2019-01-29 DIAGNOSIS — L209 Atopic dermatitis, unspecified: Secondary | ICD-10-CM | POA: Diagnosis not present

## 2019-01-31 DIAGNOSIS — E291 Testicular hypofunction: Secondary | ICD-10-CM | POA: Diagnosis not present

## 2019-01-31 DIAGNOSIS — E538 Deficiency of other specified B group vitamins: Secondary | ICD-10-CM | POA: Diagnosis not present

## 2019-02-06 ENCOUNTER — Other Ambulatory Visit (HOSPITAL_COMMUNITY): Payer: Self-pay | Admitting: *Deleted

## 2019-02-07 ENCOUNTER — Ambulatory Visit (HOSPITAL_COMMUNITY)
Admission: RE | Admit: 2019-02-07 | Discharge: 2019-02-07 | Disposition: A | Discharge status: Home / Self Care | Payer: Medicare Other | Source: Ambulatory Visit | Attending: Nephrology | Admitting: Nephrology

## 2019-02-07 ENCOUNTER — Other Ambulatory Visit: Payer: Self-pay

## 2019-02-07 DIAGNOSIS — N189 Chronic kidney disease, unspecified: Secondary | ICD-10-CM | POA: Diagnosis not present

## 2019-02-07 DIAGNOSIS — D631 Anemia in chronic kidney disease: Secondary | ICD-10-CM | POA: Insufficient documentation

## 2019-02-07 MED ORDER — SODIUM CHLORIDE 0.9 % IV SOLN
510.0000 mg | INTRAVENOUS | Status: DC
  Administered 2019-02-07: 510 mg via INTRAVENOUS
  Filled 2019-02-07: qty 510

## 2019-02-13 ENCOUNTER — Other Ambulatory Visit (HOSPITAL_COMMUNITY): Payer: Self-pay | Admitting: *Deleted

## 2019-02-14 ENCOUNTER — Ambulatory Visit (HOSPITAL_COMMUNITY)
Admission: RE | Admit: 2019-02-14 | Discharge: 2019-02-14 | Disposition: A | Discharge status: Home / Self Care | Payer: Medicare Other | Source: Ambulatory Visit | Attending: Nephrology | Admitting: Nephrology

## 2019-02-14 ENCOUNTER — Other Ambulatory Visit: Payer: Self-pay

## 2019-02-14 DIAGNOSIS — D631 Anemia in chronic kidney disease: Secondary | ICD-10-CM | POA: Diagnosis not present

## 2019-02-14 DIAGNOSIS — E291 Testicular hypofunction: Secondary | ICD-10-CM | POA: Diagnosis not present

## 2019-02-14 MED ORDER — SODIUM CHLORIDE 0.9 % IV SOLN
510.0000 mg | INTRAVENOUS | Status: DC
  Administered 2019-02-14: 510 mg via INTRAVENOUS
  Filled 2019-02-14: qty 510

## 2019-02-19 DIAGNOSIS — L299 Pruritus, unspecified: Secondary | ICD-10-CM | POA: Diagnosis not present

## 2019-02-19 DIAGNOSIS — K7460 Unspecified cirrhosis of liver: Secondary | ICD-10-CM | POA: Diagnosis not present

## 2019-02-19 DIAGNOSIS — Z86010 Personal history of colonic polyps: Secondary | ICD-10-CM | POA: Diagnosis not present

## 2019-02-20 ENCOUNTER — Other Ambulatory Visit: Payer: Self-pay | Admitting: Gastroenterology

## 2019-02-20 DIAGNOSIS — K7469 Other cirrhosis of liver: Secondary | ICD-10-CM

## 2019-02-28 DIAGNOSIS — E538 Deficiency of other specified B group vitamins: Secondary | ICD-10-CM | POA: Diagnosis not present

## 2019-02-28 DIAGNOSIS — E291 Testicular hypofunction: Secondary | ICD-10-CM | POA: Diagnosis not present

## 2019-02-28 DIAGNOSIS — K7460 Unspecified cirrhosis of liver: Secondary | ICD-10-CM | POA: Diagnosis not present

## 2019-03-05 ENCOUNTER — Ambulatory Visit
Admission: RE | Admit: 2019-03-05 | Discharge: 2019-03-05 | Disposition: A | Discharge status: Home / Self Care | Payer: Medicare Other | Source: Ambulatory Visit | Attending: Gastroenterology | Admitting: Gastroenterology

## 2019-03-05 DIAGNOSIS — Z9049 Acquired absence of other specified parts of digestive tract: Secondary | ICD-10-CM | POA: Diagnosis not present

## 2019-03-05 DIAGNOSIS — Z1289 Encounter for screening for malignant neoplasm of other sites: Secondary | ICD-10-CM | POA: Diagnosis not present

## 2019-03-05 DIAGNOSIS — K7460 Unspecified cirrhosis of liver: Secondary | ICD-10-CM | POA: Diagnosis not present

## 2019-03-05 DIAGNOSIS — K7469 Other cirrhosis of liver: Secondary | ICD-10-CM

## 2019-03-12 DIAGNOSIS — Z86010 Personal history of colonic polyps: Secondary | ICD-10-CM | POA: Diagnosis not present

## 2019-03-12 DIAGNOSIS — K7460 Unspecified cirrhosis of liver: Secondary | ICD-10-CM | POA: Diagnosis not present

## 2019-03-12 DIAGNOSIS — L299 Pruritus, unspecified: Secondary | ICD-10-CM | POA: Diagnosis not present

## 2019-03-14 DIAGNOSIS — E782 Mixed hyperlipidemia: Secondary | ICD-10-CM | POA: Diagnosis not present

## 2019-03-14 DIAGNOSIS — E1140 Type 2 diabetes mellitus with diabetic neuropathy, unspecified: Secondary | ICD-10-CM | POA: Diagnosis not present

## 2019-03-14 DIAGNOSIS — D649 Anemia, unspecified: Secondary | ICD-10-CM | POA: Diagnosis not present

## 2019-03-14 DIAGNOSIS — N184 Chronic kidney disease, stage 4 (severe): Secondary | ICD-10-CM | POA: Diagnosis not present

## 2019-03-14 DIAGNOSIS — N183 Chronic kidney disease, stage 3 (moderate): Secondary | ICD-10-CM | POA: Diagnosis not present

## 2019-03-14 DIAGNOSIS — E291 Testicular hypofunction: Secondary | ICD-10-CM | POA: Diagnosis not present

## 2019-03-14 DIAGNOSIS — I10 Essential (primary) hypertension: Secondary | ICD-10-CM | POA: Diagnosis not present

## 2019-03-14 DIAGNOSIS — F329 Major depressive disorder, single episode, unspecified: Secondary | ICD-10-CM | POA: Diagnosis not present

## 2019-03-14 DIAGNOSIS — D509 Iron deficiency anemia, unspecified: Secondary | ICD-10-CM | POA: Diagnosis not present

## 2019-03-14 DIAGNOSIS — M19049 Primary osteoarthritis, unspecified hand: Secondary | ICD-10-CM | POA: Diagnosis not present

## 2019-03-14 DIAGNOSIS — E1122 Type 2 diabetes mellitus with diabetic chronic kidney disease: Secondary | ICD-10-CM | POA: Diagnosis not present

## 2019-03-14 DIAGNOSIS — E1121 Type 2 diabetes mellitus with diabetic nephropathy: Secondary | ICD-10-CM | POA: Diagnosis not present

## 2019-03-15 DIAGNOSIS — K7460 Unspecified cirrhosis of liver: Secondary | ICD-10-CM | POA: Diagnosis not present

## 2019-03-15 DIAGNOSIS — E782 Mixed hyperlipidemia: Secondary | ICD-10-CM | POA: Diagnosis not present

## 2019-03-15 DIAGNOSIS — E1165 Type 2 diabetes mellitus with hyperglycemia: Secondary | ICD-10-CM | POA: Diagnosis not present

## 2019-03-15 DIAGNOSIS — E1122 Type 2 diabetes mellitus with diabetic chronic kidney disease: Secondary | ICD-10-CM | POA: Diagnosis not present

## 2019-03-15 DIAGNOSIS — Z794 Long term (current) use of insulin: Secondary | ICD-10-CM | POA: Diagnosis not present

## 2019-03-15 DIAGNOSIS — N183 Chronic kidney disease, stage 3 (moderate): Secondary | ICD-10-CM | POA: Diagnosis not present

## 2019-03-15 DIAGNOSIS — Z5181 Encounter for therapeutic drug level monitoring: Secondary | ICD-10-CM | POA: Diagnosis not present

## 2019-04-04 DIAGNOSIS — E538 Deficiency of other specified B group vitamins: Secondary | ICD-10-CM | POA: Diagnosis not present

## 2019-04-04 DIAGNOSIS — E291 Testicular hypofunction: Secondary | ICD-10-CM | POA: Diagnosis not present

## 2019-04-18 DIAGNOSIS — E291 Testicular hypofunction: Secondary | ICD-10-CM | POA: Diagnosis not present

## 2019-04-23 DIAGNOSIS — E559 Vitamin D deficiency, unspecified: Secondary | ICD-10-CM | POA: Diagnosis not present

## 2019-04-23 DIAGNOSIS — E782 Mixed hyperlipidemia: Secondary | ICD-10-CM | POA: Diagnosis not present

## 2019-04-23 DIAGNOSIS — D509 Iron deficiency anemia, unspecified: Secondary | ICD-10-CM | POA: Diagnosis not present

## 2019-04-23 DIAGNOSIS — E538 Deficiency of other specified B group vitamins: Secondary | ICD-10-CM | POA: Diagnosis not present

## 2019-04-23 DIAGNOSIS — K766 Portal hypertension: Secondary | ICD-10-CM | POA: Diagnosis not present

## 2019-04-23 DIAGNOSIS — E1140 Type 2 diabetes mellitus with diabetic neuropathy, unspecified: Secondary | ICD-10-CM | POA: Diagnosis not present

## 2019-04-23 DIAGNOSIS — L1230 Acquired epidermolysis bullosa, unspecified: Secondary | ICD-10-CM | POA: Diagnosis not present

## 2019-04-23 DIAGNOSIS — Z0001 Encounter for general adult medical examination with abnormal findings: Secondary | ICD-10-CM | POA: Diagnosis not present

## 2019-04-23 DIAGNOSIS — E1122 Type 2 diabetes mellitus with diabetic chronic kidney disease: Secondary | ICD-10-CM | POA: Diagnosis not present

## 2019-04-23 DIAGNOSIS — E291 Testicular hypofunction: Secondary | ICD-10-CM | POA: Diagnosis not present

## 2019-04-23 DIAGNOSIS — N184 Chronic kidney disease, stage 4 (severe): Secondary | ICD-10-CM | POA: Diagnosis not present

## 2019-04-23 DIAGNOSIS — K760 Fatty (change of) liver, not elsewhere classified: Secondary | ICD-10-CM | POA: Diagnosis not present

## 2019-05-02 DIAGNOSIS — E291 Testicular hypofunction: Secondary | ICD-10-CM | POA: Diagnosis not present

## 2019-05-08 DIAGNOSIS — E538 Deficiency of other specified B group vitamins: Secondary | ICD-10-CM | POA: Diagnosis not present

## 2019-05-14 DIAGNOSIS — K7460 Unspecified cirrhosis of liver: Secondary | ICD-10-CM | POA: Diagnosis not present

## 2019-05-14 DIAGNOSIS — L299 Pruritus, unspecified: Secondary | ICD-10-CM | POA: Diagnosis not present

## 2019-05-14 DIAGNOSIS — Z86010 Personal history of colonic polyps: Secondary | ICD-10-CM | POA: Diagnosis not present

## 2019-05-14 DIAGNOSIS — R197 Diarrhea, unspecified: Secondary | ICD-10-CM | POA: Diagnosis not present

## 2019-05-28 DIAGNOSIS — M25562 Pain in left knee: Secondary | ICD-10-CM | POA: Diagnosis not present

## 2019-05-28 DIAGNOSIS — M1712 Unilateral primary osteoarthritis, left knee: Secondary | ICD-10-CM | POA: Diagnosis not present

## 2019-06-03 DIAGNOSIS — D631 Anemia in chronic kidney disease: Secondary | ICD-10-CM | POA: Diagnosis not present

## 2019-06-03 DIAGNOSIS — Z6830 Body mass index (BMI) 30.0-30.9, adult: Secondary | ICD-10-CM | POA: Diagnosis not present

## 2019-06-03 DIAGNOSIS — I150 Renovascular hypertension: Secondary | ICD-10-CM | POA: Diagnosis not present

## 2019-06-03 DIAGNOSIS — L298 Other pruritus: Secondary | ICD-10-CM | POA: Diagnosis not present

## 2019-06-03 DIAGNOSIS — N183 Chronic kidney disease, stage 3 unspecified: Secondary | ICD-10-CM | POA: Diagnosis not present

## 2019-06-06 DIAGNOSIS — E291 Testicular hypofunction: Secondary | ICD-10-CM | POA: Diagnosis not present

## 2019-06-06 DIAGNOSIS — E538 Deficiency of other specified B group vitamins: Secondary | ICD-10-CM | POA: Diagnosis not present

## 2019-06-14 DIAGNOSIS — K7460 Unspecified cirrhosis of liver: Secondary | ICD-10-CM | POA: Diagnosis not present

## 2019-06-17 ENCOUNTER — Other Ambulatory Visit (HOSPITAL_COMMUNITY): Payer: Self-pay | Admitting: *Deleted

## 2019-06-18 ENCOUNTER — Other Ambulatory Visit: Payer: Self-pay

## 2019-06-18 ENCOUNTER — Encounter (HOSPITAL_COMMUNITY)
Admission: RE | Admit: 2019-06-18 | Discharge: 2019-06-18 | Disposition: A | Discharge status: Home / Self Care | Payer: Medicare Other | Source: Ambulatory Visit | Attending: Nephrology | Admitting: Nephrology

## 2019-06-18 DIAGNOSIS — D631 Anemia in chronic kidney disease: Secondary | ICD-10-CM | POA: Diagnosis not present

## 2019-06-18 MED ORDER — SODIUM CHLORIDE 0.9 % IV SOLN
510.0000 mg | INTRAVENOUS | Status: DC
  Administered 2019-06-18: 510 mg via INTRAVENOUS
  Filled 2019-06-18: qty 17

## 2019-06-20 DIAGNOSIS — E291 Testicular hypofunction: Secondary | ICD-10-CM | POA: Diagnosis not present

## 2019-06-20 DIAGNOSIS — R197 Diarrhea, unspecified: Secondary | ICD-10-CM | POA: Diagnosis not present

## 2019-06-20 DIAGNOSIS — K7460 Unspecified cirrhosis of liver: Secondary | ICD-10-CM | POA: Diagnosis not present

## 2019-06-20 DIAGNOSIS — Z86010 Personal history of colonic polyps: Secondary | ICD-10-CM | POA: Diagnosis not present

## 2019-06-24 DIAGNOSIS — N1831 Chronic kidney disease, stage 3a: Secondary | ICD-10-CM | POA: Diagnosis not present

## 2019-06-24 DIAGNOSIS — E782 Mixed hyperlipidemia: Secondary | ICD-10-CM | POA: Diagnosis not present

## 2019-06-24 DIAGNOSIS — K7460 Unspecified cirrhosis of liver: Secondary | ICD-10-CM | POA: Diagnosis not present

## 2019-06-24 DIAGNOSIS — E1165 Type 2 diabetes mellitus with hyperglycemia: Secondary | ICD-10-CM | POA: Diagnosis not present

## 2019-06-24 DIAGNOSIS — Z5181 Encounter for therapeutic drug level monitoring: Secondary | ICD-10-CM | POA: Diagnosis not present

## 2019-06-24 DIAGNOSIS — E1122 Type 2 diabetes mellitus with diabetic chronic kidney disease: Secondary | ICD-10-CM | POA: Diagnosis not present

## 2019-06-24 DIAGNOSIS — Z794 Long term (current) use of insulin: Secondary | ICD-10-CM | POA: Diagnosis not present

## 2019-06-24 DIAGNOSIS — R197 Diarrhea, unspecified: Secondary | ICD-10-CM | POA: Diagnosis not present

## 2019-06-25 ENCOUNTER — Encounter (HOSPITAL_COMMUNITY)
Admission: RE | Admit: 2019-06-25 | Discharge: 2019-06-25 | Disposition: A | Discharge status: Home / Self Care | Payer: Medicare Other | Source: Ambulatory Visit | Attending: Nephrology | Admitting: Nephrology

## 2019-06-25 ENCOUNTER — Other Ambulatory Visit: Payer: Self-pay

## 2019-06-25 DIAGNOSIS — D631 Anemia in chronic kidney disease: Secondary | ICD-10-CM | POA: Diagnosis not present

## 2019-06-25 MED ORDER — SODIUM CHLORIDE 0.9 % IV SOLN
510.0000 mg | INTRAVENOUS | Status: AC
  Administered 2019-06-25: 510 mg via INTRAVENOUS
  Filled 2019-06-25: qty 510

## 2019-06-26 DIAGNOSIS — S20212A Contusion of left front wall of thorax, initial encounter: Secondary | ICD-10-CM | POA: Diagnosis not present

## 2019-06-27 DIAGNOSIS — M1712 Unilateral primary osteoarthritis, left knee: Secondary | ICD-10-CM | POA: Diagnosis not present

## 2019-07-11 DIAGNOSIS — E291 Testicular hypofunction: Secondary | ICD-10-CM | POA: Diagnosis not present

## 2019-07-11 DIAGNOSIS — E538 Deficiency of other specified B group vitamins: Secondary | ICD-10-CM | POA: Diagnosis not present

## 2019-07-17 DIAGNOSIS — R600 Localized edema: Secondary | ICD-10-CM | POA: Diagnosis not present

## 2019-07-17 DIAGNOSIS — K7460 Unspecified cirrhosis of liver: Secondary | ICD-10-CM | POA: Diagnosis not present

## 2019-07-17 DIAGNOSIS — K529 Noninfective gastroenteritis and colitis, unspecified: Secondary | ICD-10-CM | POA: Diagnosis not present

## 2019-07-17 DIAGNOSIS — E538 Deficiency of other specified B group vitamins: Secondary | ICD-10-CM | POA: Diagnosis not present

## 2019-07-17 DIAGNOSIS — E291 Testicular hypofunction: Secondary | ICD-10-CM | POA: Diagnosis not present

## 2019-07-17 DIAGNOSIS — Z794 Long term (current) use of insulin: Secondary | ICD-10-CM | POA: Diagnosis not present

## 2019-07-17 DIAGNOSIS — E1122 Type 2 diabetes mellitus with diabetic chronic kidney disease: Secondary | ICD-10-CM | POA: Diagnosis not present

## 2019-07-24 DIAGNOSIS — E291 Testicular hypofunction: Secondary | ICD-10-CM | POA: Diagnosis not present

## 2019-10-09 DIAGNOSIS — F411 Generalized anxiety disorder: Secondary | ICD-10-CM | POA: Diagnosis not present

## 2019-10-09 DIAGNOSIS — Z794 Long term (current) use of insulin: Secondary | ICD-10-CM | POA: Diagnosis not present

## 2019-10-09 DIAGNOSIS — I10 Essential (primary) hypertension: Secondary | ICD-10-CM | POA: Diagnosis not present

## 2019-10-09 DIAGNOSIS — E782 Mixed hyperlipidemia: Secondary | ICD-10-CM | POA: Diagnosis not present

## 2019-10-09 DIAGNOSIS — D510 Vitamin B12 deficiency anemia due to intrinsic factor deficiency: Secondary | ICD-10-CM | POA: Diagnosis not present

## 2019-10-09 DIAGNOSIS — K7469 Other cirrhosis of liver: Secondary | ICD-10-CM | POA: Diagnosis not present

## 2019-10-09 DIAGNOSIS — M47816 Spondylosis without myelopathy or radiculopathy, lumbar region: Secondary | ICD-10-CM | POA: Diagnosis not present

## 2019-10-09 DIAGNOSIS — K219 Gastro-esophageal reflux disease without esophagitis: Secondary | ICD-10-CM | POA: Diagnosis not present

## 2019-10-09 DIAGNOSIS — Z8679 Personal history of other diseases of the circulatory system: Secondary | ICD-10-CM | POA: Diagnosis not present

## 2019-10-09 DIAGNOSIS — K529 Noninfective gastroenteritis and colitis, unspecified: Secondary | ICD-10-CM | POA: Diagnosis not present

## 2019-10-09 DIAGNOSIS — E119 Type 2 diabetes mellitus without complications: Secondary | ICD-10-CM | POA: Diagnosis not present

## 2019-10-09 DIAGNOSIS — F331 Major depressive disorder, recurrent, moderate: Secondary | ICD-10-CM | POA: Diagnosis not present

## 2019-10-09 DIAGNOSIS — S39012A Strain of muscle, fascia and tendon of lower back, initial encounter: Secondary | ICD-10-CM | POA: Diagnosis not present

## 2019-10-09 DIAGNOSIS — D508 Other iron deficiency anemias: Secondary | ICD-10-CM | POA: Diagnosis not present

## 2019-10-09 DIAGNOSIS — R1084 Generalized abdominal pain: Secondary | ICD-10-CM | POA: Diagnosis not present

## 2019-10-16 DIAGNOSIS — Z794 Long term (current) use of insulin: Secondary | ICD-10-CM | POA: Diagnosis not present

## 2019-10-16 DIAGNOSIS — E119 Type 2 diabetes mellitus without complications: Secondary | ICD-10-CM | POA: Diagnosis not present

## 2019-10-16 DIAGNOSIS — R252 Cramp and spasm: Secondary | ICD-10-CM | POA: Diagnosis not present

## 2019-10-16 DIAGNOSIS — I10 Essential (primary) hypertension: Secondary | ICD-10-CM | POA: Diagnosis not present

## 2019-10-16 DIAGNOSIS — I6529 Occlusion and stenosis of unspecified carotid artery: Secondary | ICD-10-CM | POA: Diagnosis not present

## 2019-10-18 DIAGNOSIS — I6523 Occlusion and stenosis of bilateral carotid arteries: Secondary | ICD-10-CM | POA: Diagnosis not present

## 2019-10-22 DIAGNOSIS — Z794 Long term (current) use of insulin: Secondary | ICD-10-CM | POA: Diagnosis not present

## 2019-10-22 DIAGNOSIS — I10 Essential (primary) hypertension: Secondary | ICD-10-CM | POA: Diagnosis not present

## 2019-10-22 DIAGNOSIS — E11649 Type 2 diabetes mellitus with hypoglycemia without coma: Secondary | ICD-10-CM | POA: Diagnosis not present

## 2019-10-22 DIAGNOSIS — E119 Type 2 diabetes mellitus without complications: Secondary | ICD-10-CM | POA: Diagnosis not present

## 2019-10-30 DIAGNOSIS — E119 Type 2 diabetes mellitus without complications: Secondary | ICD-10-CM | POA: Diagnosis not present

## 2019-10-30 DIAGNOSIS — I10 Essential (primary) hypertension: Secondary | ICD-10-CM | POA: Diagnosis not present

## 2019-10-30 DIAGNOSIS — R188 Other ascites: Secondary | ICD-10-CM | POA: Diagnosis not present

## 2019-10-30 DIAGNOSIS — E782 Mixed hyperlipidemia: Secondary | ICD-10-CM | POA: Diagnosis not present

## 2019-10-30 DIAGNOSIS — N1831 Chronic kidney disease, stage 3a: Secondary | ICD-10-CM | POA: Diagnosis not present

## 2019-10-30 DIAGNOSIS — R7989 Other specified abnormal findings of blood chemistry: Secondary | ICD-10-CM | POA: Diagnosis not present

## 2019-10-30 DIAGNOSIS — D510 Vitamin B12 deficiency anemia due to intrinsic factor deficiency: Secondary | ICD-10-CM | POA: Diagnosis not present

## 2019-10-30 DIAGNOSIS — D508 Other iron deficiency anemias: Secondary | ICD-10-CM | POA: Diagnosis not present

## 2019-10-30 DIAGNOSIS — Z794 Long term (current) use of insulin: Secondary | ICD-10-CM | POA: Diagnosis not present

## 2019-10-30 DIAGNOSIS — E559 Vitamin D deficiency, unspecified: Secondary | ICD-10-CM | POA: Diagnosis not present

## 2019-10-31 DIAGNOSIS — Z23 Encounter for immunization: Secondary | ICD-10-CM | POA: Diagnosis not present

## 2019-11-07 DIAGNOSIS — E119 Type 2 diabetes mellitus without complications: Secondary | ICD-10-CM | POA: Diagnosis not present

## 2019-11-07 DIAGNOSIS — D510 Vitamin B12 deficiency anemia due to intrinsic factor deficiency: Secondary | ICD-10-CM | POA: Diagnosis not present

## 2019-11-07 DIAGNOSIS — D508 Other iron deficiency anemias: Secondary | ICD-10-CM | POA: Diagnosis not present

## 2019-11-07 DIAGNOSIS — E559 Vitamin D deficiency, unspecified: Secondary | ICD-10-CM | POA: Diagnosis not present

## 2019-11-07 DIAGNOSIS — Z794 Long term (current) use of insulin: Secondary | ICD-10-CM | POA: Diagnosis not present

## 2019-11-07 DIAGNOSIS — F331 Major depressive disorder, recurrent, moderate: Secondary | ICD-10-CM | POA: Diagnosis not present

## 2019-11-07 DIAGNOSIS — E782 Mixed hyperlipidemia: Secondary | ICD-10-CM | POA: Diagnosis not present

## 2019-11-07 DIAGNOSIS — R7989 Other specified abnormal findings of blood chemistry: Secondary | ICD-10-CM | POA: Diagnosis not present

## 2019-11-07 DIAGNOSIS — K7469 Other cirrhosis of liver: Secondary | ICD-10-CM | POA: Diagnosis not present

## 2019-11-07 DIAGNOSIS — D72819 Decreased white blood cell count, unspecified: Secondary | ICD-10-CM | POA: Diagnosis not present

## 2019-11-07 DIAGNOSIS — I10 Essential (primary) hypertension: Secondary | ICD-10-CM | POA: Diagnosis not present

## 2019-11-07 DIAGNOSIS — F411 Generalized anxiety disorder: Secondary | ICD-10-CM | POA: Diagnosis not present

## 2019-11-14 DIAGNOSIS — Z8719 Personal history of other diseases of the digestive system: Secondary | ICD-10-CM | POA: Diagnosis not present

## 2019-11-14 DIAGNOSIS — E119 Type 2 diabetes mellitus without complications: Secondary | ICD-10-CM | POA: Diagnosis not present

## 2019-11-14 DIAGNOSIS — F329 Major depressive disorder, single episode, unspecified: Secondary | ICD-10-CM | POA: Diagnosis not present

## 2019-11-14 DIAGNOSIS — K219 Gastro-esophageal reflux disease without esophagitis: Secondary | ICD-10-CM | POA: Diagnosis not present

## 2019-11-14 DIAGNOSIS — Z79899 Other long term (current) drug therapy: Secondary | ICD-10-CM | POA: Diagnosis not present

## 2019-11-14 DIAGNOSIS — D509 Iron deficiency anemia, unspecified: Secondary | ICD-10-CM | POA: Diagnosis not present

## 2019-11-14 DIAGNOSIS — R188 Other ascites: Secondary | ICD-10-CM | POA: Diagnosis not present

## 2019-11-14 DIAGNOSIS — E785 Hyperlipidemia, unspecified: Secondary | ICD-10-CM | POA: Diagnosis not present

## 2019-11-14 DIAGNOSIS — R1013 Epigastric pain: Secondary | ICD-10-CM | POA: Diagnosis not present

## 2019-11-14 DIAGNOSIS — R9431 Abnormal electrocardiogram [ECG] [EKG]: Secondary | ICD-10-CM | POA: Diagnosis not present

## 2019-11-14 DIAGNOSIS — K7469 Other cirrhosis of liver: Secondary | ICD-10-CM | POA: Diagnosis not present

## 2019-11-14 DIAGNOSIS — I10 Essential (primary) hypertension: Secondary | ICD-10-CM | POA: Diagnosis not present

## 2019-11-14 DIAGNOSIS — Z7984 Long term (current) use of oral hypoglycemic drugs: Secondary | ICD-10-CM | POA: Diagnosis not present

## 2019-11-14 DIAGNOSIS — D61818 Other pancytopenia: Secondary | ICD-10-CM | POA: Diagnosis not present

## 2019-11-14 DIAGNOSIS — F419 Anxiety disorder, unspecified: Secondary | ICD-10-CM | POA: Diagnosis not present

## 2019-11-14 DIAGNOSIS — Z87891 Personal history of nicotine dependence: Secondary | ICD-10-CM | POA: Diagnosis not present

## 2019-11-14 DIAGNOSIS — M1990 Unspecified osteoarthritis, unspecified site: Secondary | ICD-10-CM | POA: Diagnosis not present

## 2019-11-15 DIAGNOSIS — R188 Other ascites: Secondary | ICD-10-CM | POA: Diagnosis not present

## 2019-11-19 DIAGNOSIS — R948 Abnormal results of function studies of other organs and systems: Secondary | ICD-10-CM | POA: Diagnosis not present

## 2019-11-19 DIAGNOSIS — I10 Essential (primary) hypertension: Secondary | ICD-10-CM | POA: Diagnosis not present

## 2019-11-19 DIAGNOSIS — R7989 Other specified abnormal findings of blood chemistry: Secondary | ICD-10-CM | POA: Diagnosis not present

## 2019-11-21 DIAGNOSIS — Z23 Encounter for immunization: Secondary | ICD-10-CM | POA: Diagnosis not present

## 2019-11-22 DIAGNOSIS — R7989 Other specified abnormal findings of blood chemistry: Secondary | ICD-10-CM | POA: Diagnosis not present

## 2019-11-22 DIAGNOSIS — N183 Chronic kidney disease, stage 3 unspecified: Secondary | ICD-10-CM | POA: Diagnosis not present

## 2019-11-22 DIAGNOSIS — D508 Other iron deficiency anemias: Secondary | ICD-10-CM | POA: Diagnosis not present

## 2019-11-22 DIAGNOSIS — D631 Anemia in chronic kidney disease: Secondary | ICD-10-CM | POA: Diagnosis not present

## 2019-11-22 DIAGNOSIS — R188 Other ascites: Secondary | ICD-10-CM | POA: Diagnosis not present

## 2019-12-09 DIAGNOSIS — R7989 Other specified abnormal findings of blood chemistry: Secondary | ICD-10-CM | POA: Diagnosis not present

## 2019-12-09 DIAGNOSIS — D631 Anemia in chronic kidney disease: Secondary | ICD-10-CM | POA: Diagnosis not present

## 2019-12-09 DIAGNOSIS — N183 Chronic kidney disease, stage 3 unspecified: Secondary | ICD-10-CM | POA: Diagnosis not present

## 2019-12-09 DIAGNOSIS — D510 Vitamin B12 deficiency anemia due to intrinsic factor deficiency: Secondary | ICD-10-CM | POA: Diagnosis not present

## 2019-12-09 DIAGNOSIS — D508 Other iron deficiency anemias: Secondary | ICD-10-CM | POA: Diagnosis not present

## 2019-12-09 DIAGNOSIS — R188 Other ascites: Secondary | ICD-10-CM | POA: Diagnosis not present

## 2020-07-01 IMAGING — CR DG LUMBAR SPINE COMPLETE 4+V
5 series · 5 of 5 positions shown · non-contrast
Comparison: None.

CLINICAL DATA: Right-sided low back pain for several months without
known injury.

EXAM:
LUMBAR SPINE - COMPLETE 4+ VIEW

[t l-spine a.p.]
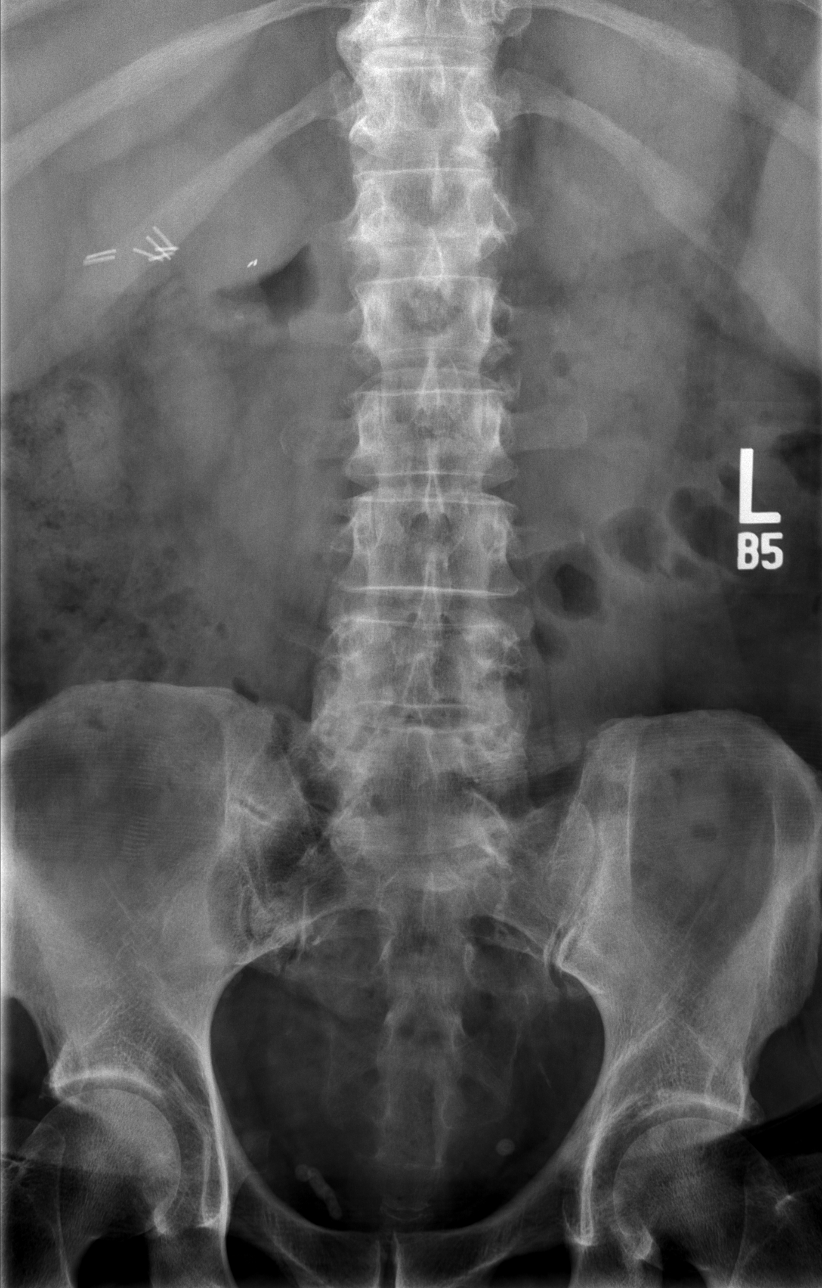

[t l-spine oblique exposure (1 of 2)]
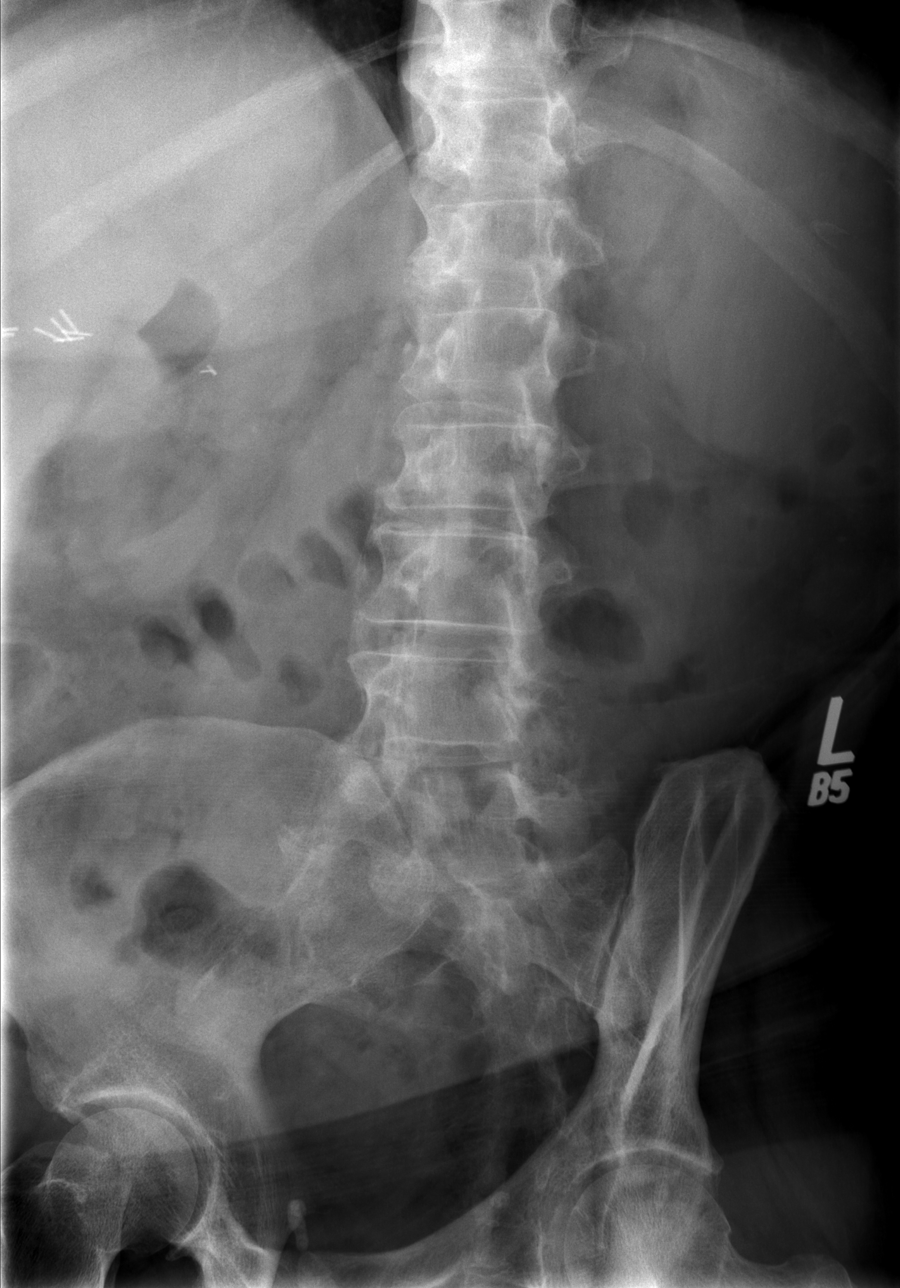

[t l-spine oblique exposure (2 of 2)]
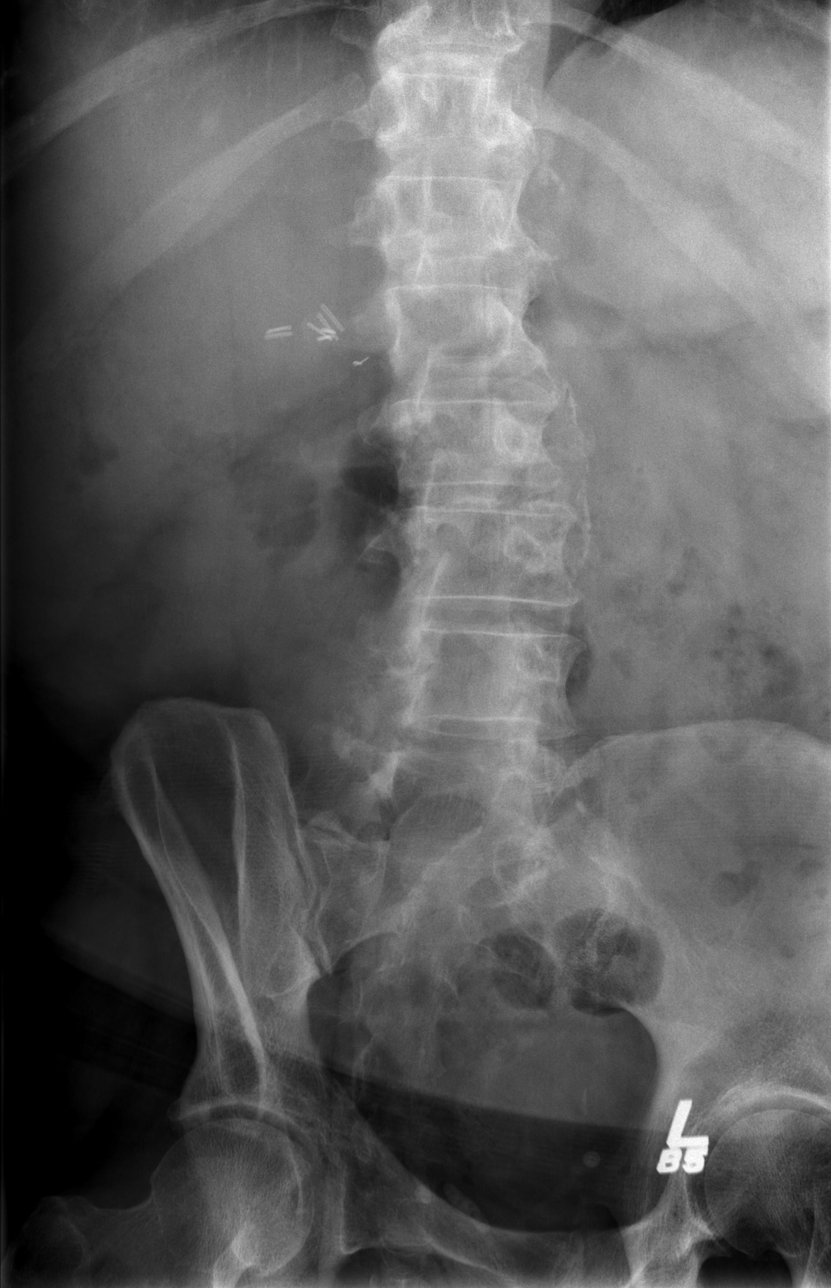

[t l-spine lat]
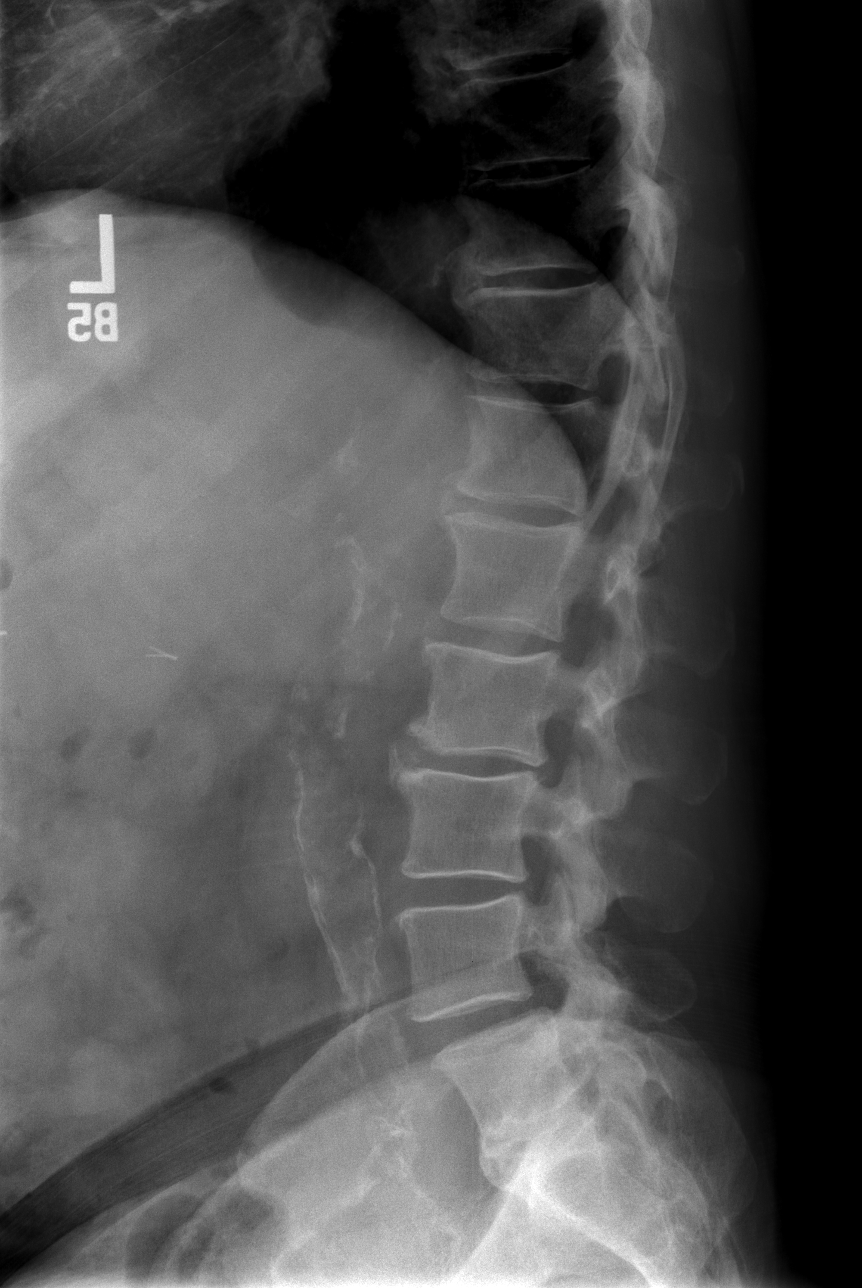

[t l-spine l5-s1 spot]
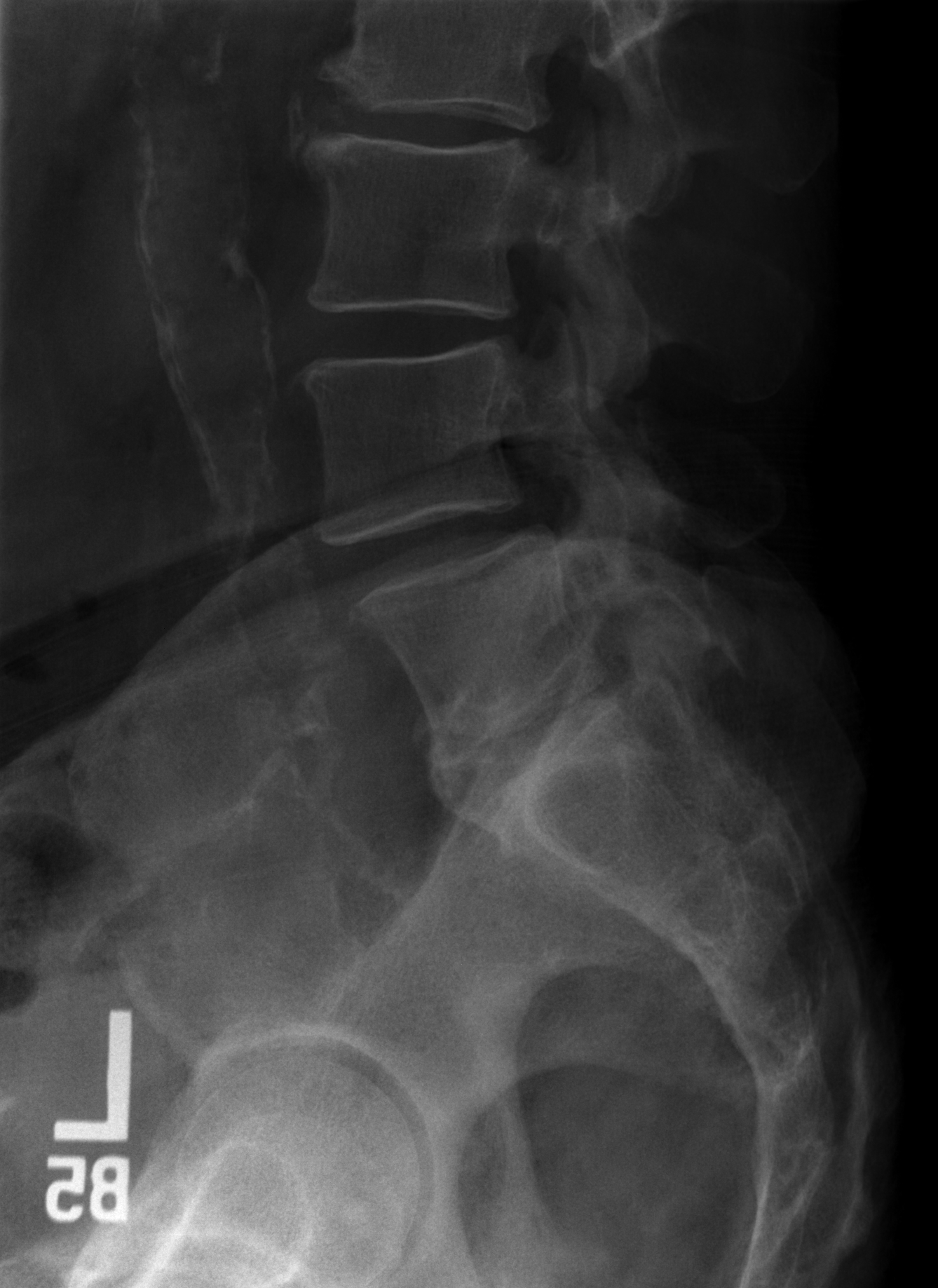

[5 of 5 positions shown; findings below may reference images not displayed]

FINDINGS: No fracture or significant spondylolisthesis is noted. Moderate
degenerative disc disease is noted at L2-3 with anterior osteophyte
formation. Moderate degenerative disc disease is noted at L5-S1.
Atherosclerosis of abdominal aorta is noted.
IMPRESSION: Multilevel degenerative disc disease. No acute abnormality seen in
the lumbar spine.

Aortic Atherosclerosis ([id number].X).

## 2021-05-29 DEATH — deceased
